# Patient Record
Sex: Male | Born: 1965 | Race: White | Hispanic: No | Marital: Married | Smoking: Never smoker
Health system: Southern US, Community
[De-identification: ages and names within clinical notes are randomized; demographics above are authoritative.]

## PROBLEM LIST (undated history)

## (undated) DIAGNOSIS — T7840XA Allergy, unspecified, initial encounter: Secondary | ICD-10-CM

## (undated) DIAGNOSIS — G039 Meningitis, unspecified: Secondary | ICD-10-CM

## (undated) DIAGNOSIS — Z87442 Personal history of urinary calculi: Secondary | ICD-10-CM

## (undated) DIAGNOSIS — M79606 Pain in leg, unspecified: Secondary | ICD-10-CM

## (undated) DIAGNOSIS — E119 Type 2 diabetes mellitus without complications: Secondary | ICD-10-CM

## (undated) DIAGNOSIS — H9190 Unspecified hearing loss, unspecified ear: Secondary | ICD-10-CM

## (undated) DIAGNOSIS — R7303 Prediabetes: Secondary | ICD-10-CM

## (undated) DIAGNOSIS — K219 Gastro-esophageal reflux disease without esophagitis: Secondary | ICD-10-CM

## (undated) DIAGNOSIS — M722 Plantar fascial fibromatosis: Secondary | ICD-10-CM

## (undated) DIAGNOSIS — Z8709 Personal history of other diseases of the respiratory system: Secondary | ICD-10-CM

## (undated) DIAGNOSIS — S52501A Unspecified fracture of the lower end of right radius, initial encounter for closed fracture: Secondary | ICD-10-CM

## (undated) HISTORY — DX: Pain in leg, unspecified: M79.606

## (undated) HISTORY — DX: Unspecified hearing loss, unspecified ear: H91.90

## (undated) HISTORY — PX: VASECTOMY: SHX75

## (undated) HISTORY — PX: WISDOM TOOTH EXTRACTION: SHX21

## (undated) HISTORY — DX: Allergy, unspecified, initial encounter: T78.40XA

## (undated) HISTORY — PX: LIPOMA EXCISION: SHX5283

## (undated) HISTORY — PX: ESOPHAGEAL DILATION: SHX303

## (undated) HISTORY — PX: APPENDECTOMY: SHX54

## (undated) HISTORY — DX: Meningitis, unspecified: G03.9

## (undated) HISTORY — DX: Plantar fascial fibromatosis: M72.2

## (undated) HISTORY — DX: Personal history of other diseases of the respiratory system: Z87.09

---

## 2000-03-19 ENCOUNTER — Other Ambulatory Visit: Admission: RE | Admit: 2000-03-19 | Discharge: 2000-03-19 | Payer: Self-pay | Admitting: Urology

## 2000-03-19 ENCOUNTER — Encounter (INDEPENDENT_AMBULATORY_CARE_PROVIDER_SITE_OTHER): Payer: Self-pay | Admitting: Specialist

## 2010-01-05 ENCOUNTER — Ambulatory Visit: Payer: Self-pay | Admitting: Family Medicine

## 2013-11-05 ENCOUNTER — Ambulatory Visit (INDEPENDENT_AMBULATORY_CARE_PROVIDER_SITE_OTHER): Payer: No Typology Code available for payment source | Admitting: Medical

## 2013-11-05 ENCOUNTER — Encounter: Payer: Self-pay | Admitting: Medical

## 2013-11-05 VITALS — BP 102/70 | HR 78 | Temp 98.0°F | Resp 16 | Ht 73.0 in | Wt 364.0 lb

## 2013-11-05 DIAGNOSIS — R112 Nausea with vomiting, unspecified: Secondary | ICD-10-CM

## 2013-11-05 DIAGNOSIS — M79609 Pain in unspecified limb: Secondary | ICD-10-CM

## 2013-11-05 DIAGNOSIS — R200 Anesthesia of skin: Secondary | ICD-10-CM

## 2013-11-05 DIAGNOSIS — E669 Obesity, unspecified: Secondary | ICD-10-CM

## 2013-11-05 DIAGNOSIS — R1310 Dysphagia, unspecified: Secondary | ICD-10-CM

## 2013-11-05 DIAGNOSIS — G5710 Meralgia paresthetica, unspecified lower limb: Secondary | ICD-10-CM

## 2013-11-05 DIAGNOSIS — H9190 Unspecified hearing loss, unspecified ear: Secondary | ICD-10-CM

## 2013-11-05 DIAGNOSIS — Z0000 Encounter for general adult medical examination without abnormal findings: Secondary | ICD-10-CM

## 2013-11-05 DIAGNOSIS — M79606 Pain in leg, unspecified: Secondary | ICD-10-CM

## 2013-11-05 DIAGNOSIS — R209 Unspecified disturbances of skin sensation: Secondary | ICD-10-CM

## 2013-11-05 LAB — CBC WITH DIFFERENTIAL/PLATELET
Basophils Absolute: 0.1 10*3/uL (ref 0.0–0.1)
Basophils Relative: 1 % (ref 0–1)
Eosinophils Absolute: 0.3 10*3/uL (ref 0.0–0.7)
Eosinophils Relative: 4 % (ref 0–5)
HCT: 42.3 % (ref 39.0–52.0)
Hemoglobin: 15.4 g/dL (ref 13.0–17.0)
Lymphocytes Relative: 24 % (ref 12–46)
Lymphs Abs: 1.8 10*3/uL (ref 0.7–4.0)
MCH: 30.7 pg (ref 26.0–34.0)
MCHC: 36.4 g/dL — ABNORMAL HIGH (ref 30.0–36.0)
MCV: 84.3 fL (ref 78.0–100.0)
Monocytes Absolute: 0.4 10*3/uL (ref 0.1–1.0)
Monocytes Relative: 6 % (ref 3–12)
Neutro Abs: 4.8 10*3/uL (ref 1.7–7.7)
Neutrophils Relative %: 65 % (ref 43–77)
Platelets: 262 10*3/uL (ref 150–400)
RBC: 5.02 MIL/uL (ref 4.22–5.81)
RDW: 12.7 % (ref 11.5–15.5)
WBC: 7.4 10*3/uL (ref 4.0–10.5)

## 2013-11-05 LAB — POCT URINALYSIS DIPSTICK
Bilirubin, UA: NEGATIVE
Glucose, UA: NEGATIVE
KETONES UA: NEGATIVE
Leukocytes, UA: NEGATIVE
Nitrite, UA: NEGATIVE
PH UA: 5
PROTEIN UA: NEGATIVE
RBC UA: NEGATIVE
SPEC GRAV UA: 1.01
UROBILINOGEN UA: NEGATIVE

## 2013-11-05 NOTE — Progress Notes (Signed)
Subjective:   HPI  Nathaniel Hahn is a 48 y.o. male who presents for a complete physical.  Returning old patient.  Accompanied by wife today.   Preventative care: Last ophthalmology visit:N/A Last dental visit:YES DR. BAGLEY Last colonoscopy:N/A Last prostate exam: YES-2006 Last EKG:YES YEARS AGO Last labs:2006  Prior vaccinations: TD or Tdap:01/05/2010 Influenza:2009 Pneumococcal:N/A Shingles/Zostavax:N/A  Advanced directive:N/A Health care power of attorney:N/A Living will:N/A  Concerns: Sometimes when he eats has feeling in throat like something is caught.  Will try and clear throat. Will drink   Gets leg numbness, upper thigh/lateral thigh ongoing.  No weakness. No back pain, no abdominal pain.  Reviewed their medical, surgical, family, social, medication, and allergy history and updated chart as appropriate.  Past Medical History  Diagnosis Date  . Allergy   . History of sinusitis     yearly  . Hearing loss     history of working around loud machinery  . Plantar fasciitis   . Leg pain, diffuse   . Meningitis     6 months old    Past Surgical History  Procedure Laterality Date  . Appendectomy    . Wisdom tooth extraction    . Vasectomy      History   Social History  . Marital Status: Married    Spouse Name: N/A    Number of Children: N/A  . Years of Education: N/A   Occupational History  . Not on file.   Social History Main Topics  . Smoking status: Never Smoker   . Smokeless tobacco: Not on file  . Alcohol Use: No  . Drug Use: No  . Sexual Activity: Not on file   Other Topics Concern  . 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 COPD Mother   . Hiatal hernia Mother   . Asthma Sister   . Heart disease Neg Hx   . Hyperlipidemia Neg Hx   . Hypertension Neg Hx   . Stroke Neg Hx   . Cancer Neg Hx     No current outpatient  prescriptions on file.  Allergies  Allergen Reactions  . Penicillins       Review of Systems Constitutional: -fever, -chills, -sweats, -unexpected weight change, -decreased appetite, +fatigue(DUE TO THE HOURS HE WORKS) Allergy: -sneezing, -itching, -congestion Dermatology: -changing moles, --rash, -lumps ENT: -runny nose, -ear pain, -sore throat, -hoarseness, -sinus pain, -teeth pain, - ringing in ears, -hearing loss, -nosebleeds Cardiology: -chest pain, -palpitations, -swelling, -difficulty breathing when lying flat, -waking up short of breath Respiratory: -cough, -shortness of breath, -difficulty breathing with exercise or exertion, -wheezing, -coughing up blood Gastroenterology: -abdominal pain, -nausea, -vomiting, -diarrhea, -constipation, -blood in stool, -changes in bowel movement, +difficulty swallowing or eating Hematology: -bleeding, -bruising  Musculoskeletal: -joint aches, -muscle aches, -joint swelling, -back pain, -neck pain, -cramping, -changes in gait Ophthalmology: denies vision changes, eye redness, itching, discharge Urology: -burning with urination, -difficulty urinating, -blood in urine, -urinary frequency, -urgency, -incontinence Neurology: -headache, -weakness, -tingling, +numbness, -memory loss, -falls, -dizziness Psychology: -depressed mood, -agitation, +sleep problems(DUE TO HOURS HE WORKS)     Objective:   Physical Exam  BP 102/70  Pulse 78  Temp(Src) 98 F (36.7 C) (Oral)  Resp 16  Ht 6\' 1"  (1.854 m)  Wt 364 lb (165.109 kg)  BMI 48.03 kg/m2  General appearance: alert, no distress, WD/WN, morbidly obese white  male Skin: scattered macules, left upper back with inflamed skin tag, no worrisome lesions HEENT: normocephalic, conjunctiva/corneas normal, sclerae anicteric, PERRLA, EOMi, nares patent, no discharge or erythema, pharynx normal Oral cavity: MMM, tongue normal, teeth in good repair Neck: supple, no lymphadenopathy, no thyromegaly, no masses,  normal ROM, on bruits Chest: non tender, normal shape and expansion Heart: RRR, normal S1, S2, no murmurs Lungs: CTA bilaterally, no wheezes, rhonchi, or rales Abdomen: +bs, soft, non tender, non distended, no masses, no hepatomegaly, no splenomegaly, no bruits Back: non tender, normal ROM, no scoliosis Musculoskeletal: upper extremities non tender, no obvious deformity, normal ROM throughout, lower extremities non tender, no obvious deformity, normal ROM throughout Extremities: mild varicosities of LE, otherwise no edema, no cyanosis, no clubbing Pulses: 2+ symmetric, upper and lower extremities, normal cap refill Neurological: right upper thigh/lateral thigh decreased sensation compared to rest of LE, otherwise alert, oriented x 3, CN2-12 intact, strength normal upper extremities and lower extremities, sensation normal throughout, DTRs 2+ throughout, no cerebellar signs, gait normal Psychiatric: normal affect, behavior normal, pleasant  GU: normal male external genitalia, circumcised, nontender, no masses, no hernia, no lymphadenopathy Rectal: deferred   Adult ECG Report  Indication: obesity, new exercise program  Rate: 75bpm  Rhythm: normal sinus rhythm  QRS Axis: 32 degrees  PR Interval:  QRS Duration:  QTc:  Conduction Disturbances: none  Other Abnormalities: none  Patient's cardiac risk factors are: male gender, obesity (BMI >= 30 kg/m2) and sedentary lifestyle.  EKG comparison: none  Narrative Interpretation: normal EKG     Assessment and Plan :      Encounter Diagnoses  Name Primary?  . Routine general medical examination at a health care facility Yes  . Obesity, unspecified   . Leg pain   . Leg numbness   . Meralgia paraesthetica   . Nausea with vomiting   . Difficulty swallowing   . Hearing loss     Physical exam - discussed healthy lifestyle, diet, exercise, preventative care, vaccinations, and addressed their concerns.   Refer for nutrition  counseling, refer for swallow study Advise weight loss through diet and exercise changes. Advise that we need to get aggressive on his weight loss We discussed his symptoms and exam suggesting meralgia paresthetica and causes.  Advised he see dentist and eye doctor yearly. Hearing exam normal.  Follow-up pending studies

## 2013-11-05 NOTE — Patient Instructions (Signed)
  Thank you for giving me the opportunity to serve you today.    Your diagnosis today includes: Encounter Diagnoses  Name Primary?  . Routine general medical examination at a health care facility Yes  . Obesity, unspecified   . Leg pain   . Leg numbness   . Meralgia paraesthetica   . Nausea with vomiting   . Difficulty swallowing   . Hearing loss      Specific recommendations today include:  We will refer you to nutritionist for counseling on diet  We will refer you for swallow study  Please work on improving your diet and exercising more to try and lose weight  We'll call with lab results  See eye doctor and dentist yearly  Return pending labs.

## 2013-11-06 ENCOUNTER — Other Ambulatory Visit: Payer: Self-pay

## 2013-11-06 LAB — COMPREHENSIVE METABOLIC PANEL
ALK PHOS: 84 U/L (ref 39–117)
ALT: 60 U/L — ABNORMAL HIGH (ref 0–53)
AST: 36 U/L (ref 0–37)
Albumin: 4.1 g/dL (ref 3.5–5.2)
BILIRUBIN TOTAL: 0.7 mg/dL (ref 0.2–1.2)
BUN: 11 mg/dL (ref 6–23)
CO2: 25 meq/L (ref 19–32)
Calcium: 9 mg/dL (ref 8.4–10.5)
Chloride: 104 mEq/L (ref 96–112)
Creat: 0.67 mg/dL (ref 0.50–1.35)
GLUCOSE: 103 mg/dL — AB (ref 70–99)
Potassium: 3.8 mEq/L (ref 3.5–5.3)
Sodium: 140 mEq/L (ref 135–145)
TOTAL PROTEIN: 6.7 g/dL (ref 6.0–8.3)

## 2013-11-06 LAB — TSH: TSH: 0.807 u[IU]/mL (ref 0.350–4.500)

## 2013-11-06 LAB — LIPID PANEL
CHOLESTEROL: 198 mg/dL (ref 0–200)
HDL: 38 mg/dL — AB (ref >39)
LDL CALC: 134 mg/dL — AB (ref 0–99)
TRIGLYCERIDES: 132 mg/dL (ref <150)
Total CHOL/HDL Ratio: 5.2 Ratio
VLDL: 26 mg/dL (ref 0–40)

## 2013-11-06 LAB — HEMOGLOBIN A1C
Hgb A1c MFr Bld: 6.1 % — ABNORMAL HIGH (ref <5.7)
Mean Plasma Glucose: 128 mg/dL — ABNORMAL HIGH (ref <117)

## 2013-11-17 ENCOUNTER — Other Ambulatory Visit: Payer: Self-pay | Admitting: Medical

## 2013-11-17 ENCOUNTER — Ambulatory Visit
Admission: RE | Admit: 2013-11-17 | Discharge: 2013-11-17 | Disposition: A | Discharge status: Home / Self Care | Payer: No Typology Code available for payment source | Source: Ambulatory Visit | Attending: Medical | Admitting: Medical

## 2013-11-17 DIAGNOSIS — E669 Obesity, unspecified: Secondary | ICD-10-CM

## 2013-11-17 DIAGNOSIS — E010 Iodine-deficiency related diffuse (endemic) goiter: Secondary | ICD-10-CM

## 2013-11-17 DIAGNOSIS — R933 Abnormal findings on diagnostic imaging of other parts of digestive tract: Secondary | ICD-10-CM

## 2013-11-21 ENCOUNTER — Other Ambulatory Visit: Payer: Self-pay | Admitting: Family Medicine

## 2013-11-21 NOTE — Progress Notes (Signed)
Patient is aware of his appointment for his Thyroid U/S 11/26/13 @ 1245 pm at [facility]. CLS

## 2013-11-26 ENCOUNTER — Ambulatory Visit
Admission: RE | Admit: 2013-11-26 | Discharge: 2013-11-26 | Disposition: A | Discharge status: Home / Self Care | Payer: No Typology Code available for payment source | Source: Ambulatory Visit | Attending: Medical | Admitting: Medical

## 2013-11-26 DIAGNOSIS — E010 Iodine-deficiency related diffuse (endemic) goiter: Secondary | ICD-10-CM

## 2013-11-26 DIAGNOSIS — R933 Abnormal findings on diagnostic imaging of other parts of digestive tract: Secondary | ICD-10-CM

## 2013-11-27 NOTE — Progress Notes (Signed)
Scheduled follow up Monday June 1 @ 9:45 with Vincenza Hews.

## 2013-12-01 ENCOUNTER — Encounter: Payer: Self-pay | Admitting: Medical

## 2013-12-01 ENCOUNTER — Telehealth: Payer: Self-pay | Admitting: Medical

## 2013-12-01 ENCOUNTER — Ambulatory Visit (INDEPENDENT_AMBULATORY_CARE_PROVIDER_SITE_OTHER): Payer: No Typology Code available for payment source | Admitting: Medical

## 2013-12-01 VITALS — BP 112/82 | HR 82 | Temp 97.6°F | Resp 16 | Wt 368.0 lb

## 2013-12-01 DIAGNOSIS — R945 Abnormal results of liver function studies: Secondary | ICD-10-CM

## 2013-12-01 DIAGNOSIS — R1310 Dysphagia, unspecified: Secondary | ICD-10-CM

## 2013-12-01 DIAGNOSIS — R7301 Impaired fasting glucose: Secondary | ICD-10-CM

## 2013-12-01 DIAGNOSIS — K449 Diaphragmatic hernia without obstruction or gangrene: Secondary | ICD-10-CM

## 2013-12-01 DIAGNOSIS — E669 Obesity, unspecified: Secondary | ICD-10-CM

## 2013-12-01 DIAGNOSIS — E041 Nontoxic single thyroid nodule: Secondary | ICD-10-CM

## 2013-12-01 DIAGNOSIS — R7989 Other specified abnormal findings of blood chemistry: Secondary | ICD-10-CM

## 2013-12-01 DIAGNOSIS — R112 Nausea with vomiting, unspecified: Secondary | ICD-10-CM

## 2013-12-01 NOTE — Telephone Encounter (Signed)
Refer to Dr. Elnoria Howard for swallowing problems, nausea and vomiting.

## 2013-12-01 NOTE — Telephone Encounter (Signed)
Patient is aware of his appointment with Dr. Elnoria Howard 12/03/13 @ 300 pm. CLS [phone]

## 2013-12-01 NOTE — Progress Notes (Signed)
   Subjective:   Nathaniel Hahn is a 48 y.o. male presenting on 12/01/2013 with Follow-up  Here for f/u on swallow study and thyroid ultrasound.  Still having difficulty swallowing.  At his recent physical visit he told me sometimes when he eats has feeling in throat like something is caught. Will try and clear throat.   Often just after starting eating he would have to go to the bathroom and vomit.  Just about every time he is now he has a sensation to vomit or sensation that the food is going down the windpipe instead.  No prior EGD, he has made changes with smaller bites, plenty of water to help with swallowing without improvement.    Here to discuss labs from recent physical. No other aggravating or relieving factors.  No other complaint.  Review of Systems ROS as in subjective      Objective:    Filed Vitals:   12/01/13 1008  BP: 112/82  Pulse: 82  Temp: 97.6 F (36.4 C)  Resp: 16    General appearance: alert, no distress, WD/WN      Assessment: Encounter Diagnoses  Name Primary?  . Obesity, unspecified Yes  . Hiatal hernia   . Difficulty swallowing   . Impaired fasting blood sugar   . Elevated LFTs   . Nausea with vomiting   . Thyroid nodule      Plan: We discussed his findings on his studies and labs.  We discussed his impaired fasting glucose, elevated hemoglobin A1c is 6.1%, mildly elevated liver tests.  Recommend he continue make efforts at diet and exercise to lose weight and eat healthy, to get good sleep everyday.   We discussed his recent swallow study and an ultrasound of the thyroid and neck.  Repeat thyroid ultrasound 1 year given the small nodule.  Referral to gastroenterology to further evaluate his swallow issues.    Nathaniel Hahn was seen today for follow-up.  Diagnoses and associated orders for this visit:  Obesity, unspecified - Ambulatory referral to Gastroenterology  Hiatal hernia - Ambulatory referral to Gastroenterology  Difficulty  swallowing - Ambulatory referral to Gastroenterology  Impaired fasting blood sugar  Elevated LFTs  Nausea with vomiting  Thyroid nodule    Return pending referral.

## 2013-12-03 ENCOUNTER — Other Ambulatory Visit: Payer: Self-pay | Admitting: Gastroenterology

## 2014-01-12 ENCOUNTER — Ambulatory Visit: Payer: Self-pay | Admitting: *Deleted

## 2014-01-16 ENCOUNTER — Ambulatory Visit (HOSPITAL_COMMUNITY)
Admission: RE | Admit: 2014-01-16 | Payer: No Typology Code available for payment source | Source: Ambulatory Visit | Admitting: Gastroenterology

## 2014-01-16 ENCOUNTER — Encounter (HOSPITAL_COMMUNITY): Admission: RE | Payer: Self-pay | Source: Ambulatory Visit

## 2014-01-16 SURGERY — EGD (ESOPHAGOGASTRODUODENOSCOPY)
Anesthesia: Moderate Sedation

## 2014-02-23 ENCOUNTER — Encounter: Payer: Self-pay | Admitting: Dietician

## 2014-02-23 ENCOUNTER — Encounter: Payer: No Typology Code available for payment source | Attending: Family Medicine | Admitting: Dietician

## 2014-02-23 VITALS — Ht 75.0 in | Wt 371.8 lb

## 2014-02-23 DIAGNOSIS — Z713 Dietary counseling and surveillance: Secondary | ICD-10-CM | POA: Diagnosis present

## 2014-02-23 DIAGNOSIS — Z6841 Body Mass Index (BMI) 40.0 and over, adult: Secondary | ICD-10-CM | POA: Insufficient documentation

## 2014-02-23 DIAGNOSIS — E669 Obesity, unspecified: Secondary | ICD-10-CM

## 2014-02-23 NOTE — Progress Notes (Signed)
  Medical Nutrition Therapy:  Appt start time: 0810 end time:  0900.   Assessment:  Primary concerns today: Nathaniel Hahn states that he is here today because he was recommended by his physician for obesity. He reports that he has gained 12-14 pounds in the last month due to his mom passing away; he considers himself a stress eater. 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 His wife and 2 teenage sons also work for [employer]. They may get home between 8-10 pm. Nathaniel Hahn admits that he knows what he needs to do to lose weight but has trouble implementing changes. He likes several vegetables and eats mainly chicken or fish, rarely steak. His HgbA1c is 6.1%.  Preferred Learning Style:   No preference indicated   Learning Readiness:   Contemplating   MEDICATIONS: none   DIETARY INTAKE:  Avoided foods include onions, broccoli, cauliflower, cabbage, tomatoes, cucumbers.    24-hr recall:  B ( AM): breakfast sandwich (Eggwhite Delight from [employer] or Croissandwich from [employer])  Snk ( AM):   L ( PM): sometimes skips, fried chicken or 1-2 slices of pizza Snk ( PM):  D (9-11 PM): Cookout or [employer] or [employer] OR Cracker Barrel or [employer]es Snk ( PM): none  Beverages: 2-4 sodas (Diet Coke or regular Coke)  Usual physical activity: none  Estimated energy needs: 2000-2200 calories  Progress Towards Goal(s):  In progress.   Nutritional Diagnosis:  [place]-3.3 Overweight/obesity As related to excessive energy intake and inappropriate food choices.  As evidenced by HgbA1c 6.1% and BMI 46.6.    Intervention:  Nutrition counseling provided. Encouraged weight loss and exercise to prevent onset of diabetes. Goals: -Develop meal routine -Avoid skipping meals -Goal: 2 "healthy" meals and 1 on the go -Work on cooking more at home -Use crock pot! -Work on [employer]	[place] on Sunday night  -Make a list when you go to the grocery store  -Avoid  keeping "trigger foods" in the house -Increase vegetable intake!  -Bring healthy lunches to work  -Raw veggies  -Sandwiches  -Fruit  -String cheese  -Nuts  -Instead of stress eating:  -Start a project around the house  -Go for a walk, shooting hoops  -Use a pedometer  -Add 2,000-3,000 steps to average  -Cut back on sodas  -Keep ice water with you at work  -Healthy weight loss: 1-2 pounds per week  Teaching Method Utilized:  Scientific laboratory technician   Barriers to learning/adherence to lifestyle change: schedule  Demonstrated degree of understanding via:  Teach Back   Monitoring/Evaluation:  Dietary intake, exercise, and body weight prn.

## 2014-02-23 NOTE — Patient Instructions (Addendum)
-  Develop meal routine -Avoid skipping meals -Goal: 2 "healthy" meals and 1 on the go -Work on cooking more at home -Use crock pot! -Work on [employer]	[place] on Sunday night  -Make a list when you go to the grocery store  -Avoid keeping "trigger foods" in the house -Increase vegetable intake!  -Bring healthy lunches to work  -Raw veggies  -Sandwiches  -Fruit  -String cheese  -Nuts  -Instead of stress eating:  -Start a project around the house  -Go for a walk, shooting hoops  -Use a pedometer  -Add 2,000-3,000 steps to average  -Cut back on sodas  -Keep ice water with you at work  -Healthy weight loss: 1-2 pounds per week

## 2015-04-02 ENCOUNTER — Ambulatory Visit (INDEPENDENT_AMBULATORY_CARE_PROVIDER_SITE_OTHER): Payer: Managed Care, Other (non HMO) | Admitting: Family Medicine

## 2015-04-02 ENCOUNTER — Encounter: Payer: Self-pay | Admitting: Family Medicine

## 2015-04-02 VITALS — BP 140/90 | HR 84 | Wt 390.0 lb

## 2015-04-02 DIAGNOSIS — L600 Ingrowing nail: Secondary | ICD-10-CM

## 2015-04-02 DIAGNOSIS — L03031 Cellulitis of right toe: Secondary | ICD-10-CM

## 2015-04-02 DIAGNOSIS — B351 Tinea unguium: Secondary | ICD-10-CM | POA: Diagnosis not present

## 2015-04-02 MED ORDER — CLARITHROMYCIN 500 MG PO TABS: 500.0000 mg | ORAL_TABLET | Freq: Two times a day (BID) | ORAL | Status: DC

## 2015-04-02 MED ORDER — TRAMADOL HCL 50 MG PO TABS: 50.0000 mg | ORAL_TABLET | Freq: Three times a day (TID) | ORAL | Status: DC | PRN

## 2015-04-02 NOTE — Progress Notes (Signed)
   Subjective:    Patient ID: Nathaniel Hahn, male    DOB: 07/13/1965, 49 y.o.   MRN: [id number]  HPI  He started noting pain and swelling of the right great toenail that got much worse yesterday and became more red and painful today. He has had previous nail excisions in the past.   Review of Systems     Objective:   Physical Exam  great toenail is thickened with evidence of recent bleeding laterally. The great toe is red and tender on the dorsal and plantar surface.       Assessment & Plan:  Ingrown nail - Plan: clarithromycin (BIAXIN) 500 MG tablet, Ambulatory referral to Podiatry, traMADol (ULTRAM) 50 MG tablet  Cellulitis of toe of right foot - Plan: clarithromycin (BIAXIN) 500 MG tablet, Ambulatory referral to Podiatry, traMADol (ULTRAM) 50 MG tablet  Onychomycosis  he has a penicillin allergy and I will therefore place him on Biaxin. We'll also give him tramadol for pain. He will be referred to podiatry. Cautioned that if his symptoms worsen over the weekend, go to the emergency room.

## 2015-07-30 ENCOUNTER — Emergency Department (HOSPITAL_COMMUNITY)
Admission: EM | Admit: 2015-07-30 | Discharge: 2015-07-31 | Disposition: A | Discharge status: Home / Self Care | Payer: Managed Care, Other (non HMO) | Attending: Emergency Medicine | Admitting: Emergency Medicine

## 2015-07-30 ENCOUNTER — Encounter (HOSPITAL_COMMUNITY): Payer: Self-pay | Admitting: Emergency Medicine

## 2015-07-30 ENCOUNTER — Emergency Department (HOSPITAL_COMMUNITY): Payer: Managed Care, Other (non HMO)

## 2015-07-30 DIAGNOSIS — W010XXA Fall on same level from slipping, tripping and stumbling without subsequent striking against object, initial encounter: Secondary | ICD-10-CM | POA: Insufficient documentation

## 2015-07-30 DIAGNOSIS — S52601A Unspecified fracture of lower end of right ulna, initial encounter for closed fracture: Secondary | ICD-10-CM | POA: Diagnosis not present

## 2015-07-30 DIAGNOSIS — H9190 Unspecified hearing loss, unspecified ear: Secondary | ICD-10-CM | POA: Diagnosis not present

## 2015-07-30 DIAGNOSIS — Z792 Long term (current) use of antibiotics: Secondary | ICD-10-CM | POA: Insufficient documentation

## 2015-07-30 DIAGNOSIS — Z8709 Personal history of other diseases of the respiratory system: Secondary | ICD-10-CM | POA: Insufficient documentation

## 2015-07-30 DIAGNOSIS — Z880 Allergy status to penicillin: Secondary | ICD-10-CM | POA: Diagnosis not present

## 2015-07-30 DIAGNOSIS — S62101A Fracture of unspecified carpal bone, right wrist, initial encounter for closed fracture: Secondary | ICD-10-CM

## 2015-07-30 DIAGNOSIS — Y998 Other external cause status: Secondary | ICD-10-CM | POA: Diagnosis not present

## 2015-07-30 DIAGNOSIS — Z8669 Personal history of other diseases of the nervous system and sense organs: Secondary | ICD-10-CM | POA: Diagnosis not present

## 2015-07-30 DIAGNOSIS — S80211A Abrasion, right knee, initial encounter: Secondary | ICD-10-CM | POA: Diagnosis not present

## 2015-07-30 DIAGNOSIS — Z8739 Personal history of other diseases of the musculoskeletal system and connective tissue: Secondary | ICD-10-CM | POA: Insufficient documentation

## 2015-07-30 DIAGNOSIS — Y9389 Activity, other specified: Secondary | ICD-10-CM | POA: Insufficient documentation

## 2015-07-30 DIAGNOSIS — S6991XA Unspecified injury of right wrist, hand and finger(s), initial encounter: Secondary | ICD-10-CM | POA: Diagnosis present

## 2015-07-30 DIAGNOSIS — S52501A Unspecified fracture of the lower end of right radius, initial encounter for closed fracture: Secondary | ICD-10-CM | POA: Insufficient documentation

## 2015-07-30 DIAGNOSIS — Y9264 Mine or pit as the place of occurrence of the external cause: Secondary | ICD-10-CM | POA: Diagnosis not present

## 2015-07-30 MED ORDER — HYDROMORPHONE HCL 1 MG/ML IJ SOLN
1.0000 mg | Freq: Once | INTRAMUSCULAR | Status: AC
  Administered 2015-07-30: 1 mg via INTRAMUSCULAR
  Filled 2015-07-30: qty 1

## 2015-07-30 MED ORDER — OXYCODONE-ACETAMINOPHEN 5-325 MG PO TABS: 1.0000 | ORAL_TABLET | ORAL | Status: DC | PRN

## 2015-07-30 NOTE — Discharge Instructions (Signed)
Take pain medications as prescribed. Keep the wrist elevated. Ice several times a day. Please follow-up with Dr. Merlyn Lot, call their office first thing on Monday. Follow-up with him for further treatment.  Wrist Fracture A wrist fracture is a break or crack in one of the bones of your wrist. Your wrist is made up of eight small bones at the palm of your hand (carpal bones) and two long bones that make up your forearm (radius and ulna). CAUSES  A direct blow to the wrist.  Falling on an outstretched hand.  Trauma, such as a car accident or a fall. RISK FACTORS Risk factors for wrist fracture include:  Participating in contact and high-risk sports, such as skiing, biking, and ice skating.  Taking steroid medicines.  Smoking.  Being male.  Being Caucasian.  Drinking more than three alcoholic beverages per day.  Having low or lowered bone density (osteoporosis or osteopenia).  Age. Older adults have decreased bone density.  Women who have had menopause.  History of previous fractures. SIGNS AND SYMPTOMS Symptoms of wrist fractures include tenderness, bruising, and inflammation. Additionally, the wrist may hang in an odd position or appear deformed. DIAGNOSIS Diagnosis may include:  Physical exam.  X-ray. TREATMENT Treatment depends on many factors, including the nature and location of the fracture, your age, and your activity level. Treatment for wrist fracture can be nonsurgical or surgical. Nonsurgical Treatment A plaster cast or splint may be applied to your wrist if the bone is in a good position. If the fracture is not in good position, it may be necessary for your health care provider to realign it before applying a splint or cast. Usually, a cast or splint will be worn for several weeks. Surgical Treatment Sometimes the position of the bone is so far out of place that surgery is required to apply a device to hold it together as it heals. Depending on the fracture,  there are a number of options for holding the bone in place while it heals, such as a cast and metal pins. HOME CARE INSTRUCTIONS  Keep your injured wrist elevated and move your fingers as much as possible.  Do not put pressure on any part of your cast or splint. It may break.  Use a plastic bag to protect your cast or splint from water while bathing or showering. Do not lower your cast or splint into water.  Take medicines only as directed by your health care provider.  Keep your cast or splint clean and dry. If it becomes wet, damaged, or suddenly feels too tight, contact your health care provider right away.  Do not use any tobacco products including cigarettes, chewing tobacco, or electronic cigarettes. Tobacco can delay bone healing. If you need help quitting, ask your health care provider.  Keep all follow-up visits as directed by your health care provider. This is important.  Ask your health care provider if you should take supplements of calcium and vitamins C and D to promote bone healing. SEEK MEDICAL CARE IF:  Your cast or splint is damaged, breaks, or gets wet.  You have a fever.  You have chills.  You have continued severe pain or more swelling than you did before the cast was put on. SEEK IMMEDIATE MEDICAL CARE IF:  Your hand or fingernails on the injured arm turn blue or gray, or feel cold or numb.  You have decreased feeling in the fingers of your injured arm. MAKE SURE YOU:  Understand these instructions.  Will  watch your condition.  Will get help right away if you are not doing well or get worse.   This information is not intended to replace advice given to you by your health care provider. Make sure you discuss any questions you have with your health care provider.   Document Released: 03/29/2005 Document Revised: 03/10/2015 Document Reviewed: 07/07/2011 Elsevier Interactive Patient Education [employer].

## 2015-07-30 NOTE — ED Notes (Signed)
Ortho tech paged for splint.

## 2015-07-30 NOTE — ED Provider Notes (Signed)
CSN: [id number]     Arrival date & time 07/30/15  2031 History  By signing my name below, I, Nathaniel Hahn, attest that this documentation has been prepared under the direction and in the presence of Noah Lembke, PA-C. Electronically Signed: [name], ED Scribe. 07/30/2015. 10:18 PM.    Chief Complaint  Patient presents with  . Wrist Injury   The history is provided by the patient. No language interpreter was used.    HPI Comments: Nathaniel Hahn is a 50 y.o. male who is right hand dominant presents to the Emergency Department complaining of a fall that occurred at 5:30 PM. Pt notes he slipped in gravel while leaning against a car resulting in associated, 8/10, right wrist pain and a mild abrasion to his right knee. He was seen at [facility] following the incident and dx with a closed fracture to his right distal radius and was told to come to the ED for a wrist reduction to displace the fracture. He was given Toradol for his pain which provided short term relief noting it has worsened again since arrival. Pt states his pain worsens with any movement of the arm or fingers. He confirms his listed allergies. Pt denies right elbow pain, neck pain, back pain, LOC or any other associated symptoms at this time.   Past Medical History  Diagnosis Date  . Allergy   . History of sinusitis     yearly  . Hearing loss     history of working around loud machinery  . Plantar fasciitis   . Leg pain, diffuse   . Meningitis     6 months old   Past Surgical History  Procedure Laterality Date  . Appendectomy    . Wisdom tooth extraction    . Vasectomy     Family History  Problem Relation Age of Onset  . COPD Mother   . Hiatal hernia Mother   . Asthma Sister   . Heart disease Neg Hx   . Hyperlipidemia Neg Hx   . Hypertension Neg Hx   . Stroke Neg Hx   . Cancer Neg Hx    Social History  Substance Use Topics  . Smoking status: Never Smoker   . Smokeless tobacco: None  . Alcohol Use:  Yes    Review of Systems  Musculoskeletal: Positive for joint swelling and arthralgias. Negative for back pain and neck pain.  Skin: Positive for wound.  Neurological: Negative for syncope.    Allergies  Onion and Penicillins  Home Medications   Prior to Admission medications   Medication Sig Start Date End Date Taking? Authorizing Provider  clarithromycin (BIAXIN) 500 MG tablet Take 1 tablet (500 mg total) by mouth 2 (two) times daily. 04/02/15   [name], MD  traMADol (ULTRAM) 50 MG tablet Take 1 tablet (50 mg total) by mouth every 8 (eight) hours as needed. 04/02/15   [name], MD   BP 120/68 mmHg  Pulse 93  Temp(Src) 98.5 F (36.9 C) (Oral)  Resp 16  SpO2 92%    Physical Exam  Constitutional: He is oriented to person, place, and time. He appears well-developed and well-nourished.  HENT:  Head: Normocephalic and atraumatic.  Eyes: EOM are normal.  Neck: Normal range of motion.  Cardiovascular: Normal rate.   Pulmonary/Chest: Effort normal. No respiratory distress.  Abdominal: Soft.  Musculoskeletal: Normal range of motion.  Obvious deformity to the right wrist. Unable to move. Hand and fingers are pink and warm. Cap refill  less than 2 seconds distally. Radial pulse intact. No tenderness over elbow joint.  Neurological: He is alert and oriented to person, place, and time.  Skin: Skin is warm and dry.  Psychiatric: He has a normal mood and affect.  Nursing note and vitals reviewed.   ED Course  Procedures  DIAGNOSTIC STUDIES: Oxygen Saturation is 92% on RA, adequate by my interpretation.    COORDINATION OF CARE: 10:15 PM Discussed next steps with pt. He understood and is agreeable to the plan.   Labs Review Labs Reviewed - No data to display  Imaging Review Dg Wrist Complete Right  07/30/2015  CLINICAL DATA:  Fall with distal radius fracture. EXAM: RIGHT WRIST - COMPLETE 3+ VIEW COMPARISON:  None. FINDINGS: Three views study has fine detail obscured  by the overlying fiberglass cast. There is a comminuted fracture the distal radius with intra-articular extension. Associated ulnar styloid fracture noted. IMPRESSION: Distal fractures of the radius and ulna. Electronically Signed   By: [name] M.D.   On: 07/30/2015 23:08   I have personally reviewed and evaluated these images and lab results as part of my medical decision-making.   EKG Interpretation None      MDM   Final diagnoses:  Wrist fracture, right, closed, initial encounter   Patient emergency department from urgent care with right wrist fracture. Patient has images on the CD with him. I tried multiple computers to open images but was unable to do so. Will reorder images, also so that the consulting physician can review on upon consultation. Patient informed of the plan. Dilaudid IM ordered for pain. Patient states he had Toradol at urgent care which did not help much.   11:33 PM Fracture as described above. Spoke with Dr. Merlyn Lot, who instructed to place patient in a sugar tong splint, follow-up with him on Monday in the office. I discussed plan with patient, patient initially hesitant, surprise that "you're not going to do anything today? I am not even going to see orthopedics doctor today?." I explained to him that i did speak with  Dr. Merlyn Lot did not think that his wrist needed emergent reduction based on the x-rays that he reviewed. He most likely will need surgical repair of the wrist. He will be seen this upcoming week by Dr. Merlyn Lot and this will be discussed and planned. Patient explained that he is more surprised and upset because urgent care that sent him here told him that he will see orthopedic specialist here and his wrist will be fixed today. Splints are placed by our orthopedic staff. He'll be discharged home with pain management, elevation of the wrist, icing, follow-up as instructed. Patient agreed to the plan.  Filed Vitals:   07/30/15 2038  BP: 120/68  Pulse: 93   Temp: 98.5 F (36.9 C)  TempSrc: Oral  Resp: 16  SpO2: 92%    I personally performed the services described in this documentation, which was scribed in my presence. The recorded information has been reviewed and is accurate.   Jaynie Crumble, PA-C 07/31/15 1931  Richardean Canal, MD 07/31/15 2246

## 2015-07-30 NOTE — ED Notes (Addendum)
Pt slipped in gravel while leaning against car and fell around 5:30pm today.  Went to [facility] and diagnosed with closed fracture to R wrist.  Pt states he was told to come to ED for wrist reduction due to displaced radius fracture.  Pt states he received Toradol for pain.  C/o 8/10 pain to R wrist and abrasion to R knee.  Denies LOC.  Denies neck and back pain.

## 2015-08-02 ENCOUNTER — Other Ambulatory Visit: Payer: Self-pay | Admitting: Orthopedic Surgery

## 2015-08-02 ENCOUNTER — Encounter (HOSPITAL_BASED_OUTPATIENT_CLINIC_OR_DEPARTMENT_OTHER): Payer: Self-pay | Admitting: *Deleted

## 2015-08-03 ENCOUNTER — Ambulatory Visit (HOSPITAL_BASED_OUTPATIENT_CLINIC_OR_DEPARTMENT_OTHER): Payer: Managed Care, Other (non HMO) | Admitting: Certified Registered"

## 2015-08-03 ENCOUNTER — Encounter (HOSPITAL_BASED_OUTPATIENT_CLINIC_OR_DEPARTMENT_OTHER): Payer: Self-pay | Admitting: *Deleted

## 2015-08-03 ENCOUNTER — Encounter (HOSPITAL_BASED_OUTPATIENT_CLINIC_OR_DEPARTMENT_OTHER)
Admission: RE | Disposition: A | Discharge status: Home / Self Care | Payer: Self-pay | Source: Ambulatory Visit | Attending: Orthopedic Surgery

## 2015-08-03 ENCOUNTER — Ambulatory Visit (HOSPITAL_BASED_OUTPATIENT_CLINIC_OR_DEPARTMENT_OTHER)
Admission: RE | Admit: 2015-08-03 | Discharge: 2015-08-03 | Disposition: A | Discharge status: Home / Self Care | Payer: Managed Care, Other (non HMO) | Source: Ambulatory Visit | Attending: Orthopedic Surgery | Admitting: Orthopedic Surgery

## 2015-08-03 DIAGNOSIS — W010XXA Fall on same level from slipping, tripping and stumbling without subsequent striking against object, initial encounter: Secondary | ICD-10-CM | POA: Diagnosis not present

## 2015-08-03 DIAGNOSIS — S52571A Other intraarticular fracture of lower end of right radius, initial encounter for closed fracture: Secondary | ICD-10-CM | POA: Diagnosis not present

## 2015-08-03 HISTORY — PX: OPEN REDUCTION INTERNAL FIXATION (ORIF) DISTAL RADIAL FRACTURE: SHX5989

## 2015-08-03 HISTORY — DX: Unspecified fracture of the lower end of right radius, initial encounter for closed fracture: S52.501A

## 2015-08-03 SURGERY — OPEN REDUCTION INTERNAL FIXATION (ORIF) DISTAL RADIUS FRACTURE
Anesthesia: General | Site: Wrist | Laterality: Right

## 2015-08-03 MED ORDER — SCOPOLAMINE 1 MG/3DAYS TD PT72: 1.0000 | MEDICATED_PATCH | Freq: Once | TRANSDERMAL | Status: DC | PRN

## 2015-08-03 MED ORDER — VANCOMYCIN HCL 10 G IV SOLR
1500.0000 mg | INTRAVENOUS | Status: AC
  Administered 2015-08-03: 1500 mg via INTRAVENOUS

## 2015-08-03 MED ORDER — LACTATED RINGERS IV SOLN: INTRAVENOUS | Status: DC

## 2015-08-03 MED ORDER — MIDAZOLAM HCL 2 MG/2ML IJ SOLN
1.0000 mg | INTRAMUSCULAR | Status: DC | PRN
  Administered 2015-08-03 (×2): 2 mg via INTRAVENOUS

## 2015-08-03 MED ORDER — FENTANYL CITRATE (PF) 100 MCG/2ML IJ SOLN
INTRAMUSCULAR | Status: AC
  Filled 2015-08-03: qty 2

## 2015-08-03 MED ORDER — BUPIVACAINE HCL (PF) 0.25 % IJ SOLN
INTRAMUSCULAR | Status: DC | PRN
  Administered 2015-08-03: 10 mL

## 2015-08-03 MED ORDER — LACTATED RINGERS IV SOLN
INTRAVENOUS | Status: DC
  Administered 2015-08-03 (×2): via INTRAVENOUS

## 2015-08-03 MED ORDER — ONDANSETRON HCL 4 MG/2ML IJ SOLN
INTRAMUSCULAR | Status: DC | PRN
  Administered 2015-08-03: 4 mg via INTRAVENOUS

## 2015-08-03 MED ORDER — HYDROMORPHONE HCL 1 MG/ML IJ SOLN
INTRAMUSCULAR | Status: AC
  Filled 2015-08-03: qty 1

## 2015-08-03 MED ORDER — DEXAMETHASONE SODIUM PHOSPHATE 10 MG/ML IJ SOLN
INTRAMUSCULAR | Status: DC | PRN
  Administered 2015-08-03: 10 mg via INTRAVENOUS

## 2015-08-03 MED ORDER — VANCOMYCIN HCL IN DEXTROSE 1-5 GM/200ML-% IV SOLN: 1000.0000 mg | INTRAVENOUS | Status: DC

## 2015-08-03 MED ORDER — BUPIVACAINE-EPINEPHRINE (PF) 0.5% -1:200000 IJ SOLN
INTRAMUSCULAR | Status: DC | PRN
  Administered 2015-08-03: 30 mL via PERINEURAL

## 2015-08-03 MED ORDER — MIDAZOLAM HCL 2 MG/2ML IJ SOLN
INTRAMUSCULAR | Status: AC
  Filled 2015-08-03: qty 2

## 2015-08-03 MED ORDER — BUPIVACAINE HCL (PF) 0.25 % IJ SOLN
INTRAMUSCULAR | Status: AC
  Filled 2015-08-03: qty 30

## 2015-08-03 MED ORDER — ONDANSETRON HCL 4 MG/2ML IJ SOLN
INTRAMUSCULAR | Status: AC
  Filled 2015-08-03: qty 2

## 2015-08-03 MED ORDER — ACETAMINOPHEN 10 MG/ML IV SOLN
INTRAVENOUS | Status: AC
  Filled 2015-08-03: qty 100

## 2015-08-03 MED ORDER — FENTANYL CITRATE (PF) 100 MCG/2ML IJ SOLN
50.0000 ug | INTRAMUSCULAR | Status: AC | PRN
  Administered 2015-08-03: 100 ug via INTRAVENOUS
  Administered 2015-08-03 (×2): 25 ug via INTRAVENOUS
  Administered 2015-08-03: 100 ug via INTRAVENOUS
  Administered 2015-08-03 (×2): 25 ug via INTRAVENOUS

## 2015-08-03 MED ORDER — VANCOMYCIN HCL IN DEXTROSE 1-5 GM/200ML-% IV SOLN
INTRAVENOUS | Status: AC
  Filled 2015-08-03: qty 200

## 2015-08-03 MED ORDER — 0.9 % SODIUM CHLORIDE (POUR BTL) OPTIME
TOPICAL | Status: DC | PRN
  Administered 2015-08-03: 220 mL

## 2015-08-03 MED ORDER — LIDOCAINE HCL (CARDIAC) 20 MG/ML IV SOLN
INTRAVENOUS | Status: AC
  Filled 2015-08-03: qty 5

## 2015-08-03 MED ORDER — PROPOFOL 10 MG/ML IV BOLUS
INTRAVENOUS | Status: DC | PRN
  Administered 2015-08-03: 260 mg via INTRAVENOUS

## 2015-08-03 MED ORDER — CHLORHEXIDINE GLUCONATE 4 % EX LIQD: 60.0000 mL | Freq: Once | CUTANEOUS | Status: DC

## 2015-08-03 MED ORDER — ACETAMINOPHEN 10 MG/ML IV SOLN
1000.0000 mg | Freq: Four times a day (QID) | INTRAVENOUS | Status: DC
  Administered 2015-08-03: 1000 mg via INTRAVENOUS

## 2015-08-03 MED ORDER — LIDOCAINE HCL (CARDIAC) 20 MG/ML IV SOLN
INTRAVENOUS | Status: DC | PRN
  Administered 2015-08-03: 30 mg via INTRAVENOUS

## 2015-08-03 MED ORDER — HYDROMORPHONE HCL 1 MG/ML IJ SOLN
0.2500 mg | INTRAMUSCULAR | Status: DC | PRN
  Administered 2015-08-03 (×3): 0.5 mg via INTRAVENOUS

## 2015-08-03 MED ORDER — OXYCODONE-ACETAMINOPHEN 5-325 MG PO TABS: ORAL_TABLET | ORAL | Status: DC

## 2015-08-03 MED ORDER — DEXAMETHASONE SODIUM PHOSPHATE 10 MG/ML IJ SOLN
INTRAMUSCULAR | Status: AC
  Filled 2015-08-03: qty 1

## 2015-08-03 MED ORDER — KETOROLAC TROMETHAMINE 30 MG/ML IJ SOLN
INTRAMUSCULAR | Status: AC
Start: 2015-08-03 — End: 2015-08-03
  Filled 2015-08-03: qty 1

## 2015-08-03 MED ORDER — MEPERIDINE HCL 25 MG/ML IJ SOLN: 6.2500 mg | INTRAMUSCULAR | Status: DC | PRN

## 2015-08-03 MED ORDER — GLYCOPYRROLATE 0.2 MG/ML IJ SOLN: 0.2000 mg | Freq: Once | INTRAMUSCULAR | Status: DC | PRN

## 2015-08-03 MED ORDER — KETOROLAC TROMETHAMINE 30 MG/ML IJ SOLN
30.0000 mg | Freq: Once | INTRAMUSCULAR | Status: AC
  Administered 2015-08-03: 30 mg via INTRAVENOUS

## 2015-08-03 MED ORDER — PROMETHAZINE HCL 25 MG/ML IJ SOLN: 6.2500 mg | INTRAMUSCULAR | Status: DC | PRN

## 2015-08-03 SURGICAL SUPPLY — 77 items
BANDAGE ACE 3X5.8 VEL STRL LF (GAUZE/BANDAGES/DRESSINGS) ×2 IMPLANT
BANDAGE ACE 4X5 VEL STRL LF (GAUZE/BANDAGES/DRESSINGS) ×2 IMPLANT
BIT DRILL 2.0 LNG QUCK RELEASE (BIT) ×1 IMPLANT
BIT DRILL 2.8X5 QR DISP (BIT) ×2 IMPLANT
BLADE SURG 15 STRL LF DISP TIS (BLADE) ×2 IMPLANT
BLADE SURG 15 STRL SS (BLADE) ×2
BNDG ESMARK 4X9 LF (GAUZE/BANDAGES/DRESSINGS) ×2 IMPLANT
BNDG GAUZE ELAST 4 BULKY (GAUZE/BANDAGES/DRESSINGS) ×2 IMPLANT
BNDG PLASTER X FAST 3X3 WHT LF (CAST SUPPLIES) IMPLANT
CHLORAPREP W/TINT 26ML (MISCELLANEOUS) ×2 IMPLANT
CORDS BIPOLAR (ELECTRODE) ×2 IMPLANT
COVER BACK TABLE 60X90IN (DRAPES) ×2 IMPLANT
COVER MAYO STAND STRL (DRAPES) ×2 IMPLANT
CUFF TOURNIQUET SINGLE 24IN (TOURNIQUET CUFF) ×2 IMPLANT
DRAPE EXTREMITY T 121X128X90 (DRAPE) ×2 IMPLANT
DRAPE OEC MINIVIEW 54X84 (DRAPES) ×2 IMPLANT
DRAPE SURG 17X23 STRL (DRAPES) ×2 IMPLANT
DRILL 2.0 LNG QUICK RELEASE (BIT) ×2
GAUZE SPONGE 4X4 12PLY STRL (GAUZE/BANDAGES/DRESSINGS) ×2 IMPLANT
GAUZE SPONGE 4X4 16PLY XRAY LF (GAUZE/BANDAGES/DRESSINGS) ×2 IMPLANT
GAUZE XEROFORM 1X8 LF (GAUZE/BANDAGES/DRESSINGS) ×2 IMPLANT
GLOVE BIO SURGEON STRL SZ 6.5 (GLOVE) ×2 IMPLANT
GLOVE BIO SURGEON STRL SZ7 (GLOVE) ×2 IMPLANT
GLOVE BIO SURGEON STRL SZ7.5 (GLOVE) ×2 IMPLANT
GLOVE BIOGEL PI IND STRL 7.0 (GLOVE) ×2 IMPLANT
GLOVE BIOGEL PI IND STRL 7.5 (GLOVE) ×1 IMPLANT
GLOVE BIOGEL PI IND STRL 8 (GLOVE) ×1 IMPLANT
GLOVE BIOGEL PI IND STRL 8.5 (GLOVE) IMPLANT
GLOVE BIOGEL PI INDICATOR 7.0 (GLOVE) ×2
GLOVE BIOGEL PI INDICATOR 7.5 (GLOVE) ×1
GLOVE BIOGEL PI INDICATOR 8 (GLOVE) ×1
GLOVE BIOGEL PI INDICATOR 8.5 (GLOVE)
GLOVE SURG ORTHO 8.0 STRL STRW (GLOVE) IMPLANT
GOWN STRL REUS W/ TWL LRG LVL3 (GOWN DISPOSABLE) ×1 IMPLANT
GOWN STRL REUS W/TWL LRG LVL3 (GOWN DISPOSABLE) ×1
GOWN STRL REUS W/TWL XL LVL3 (GOWN DISPOSABLE) ×4 IMPLANT
GUIDEWIRE ORTHO 0.054X6 (WIRE) ×6 IMPLANT
NEEDLE HYPO 25X1 1.5 SAFETY (NEEDLE) ×2 IMPLANT
NS IRRIG 1000ML POUR BTL (IV SOLUTION) ×2 IMPLANT
PACK BASIN DAY SURGERY FS (CUSTOM PROCEDURE TRAY) ×2 IMPLANT
PAD CAST 3X4 CTTN HI CHSV (CAST SUPPLIES) ×1 IMPLANT
PADDING CAST ABS 4INX4YD NS (CAST SUPPLIES) ×1
PADDING CAST ABS COTTON 4X4 ST (CAST SUPPLIES) ×1 IMPLANT
PADDING CAST COTTON 3X4 STRL (CAST SUPPLIES) ×1
PLATE PROXIMAL ACULOC 2 WD RT (Plate) ×2 IMPLANT
SCREW CORT FT 18X2.3XLCK HEX (Screw) ×1 IMPLANT
SCREW CORT FT 20X2.3XLCK HEX (Screw) ×1 IMPLANT
SCREW CORTICAL LOCKING 2.3X18M (Screw) ×1 IMPLANT
SCREW CORTICAL LOCKING 2.3X20M (Screw) ×4 IMPLANT
SCREW CORTICAL LOCKING 2.3X22M (Screw) ×2 IMPLANT
SCREW FX20X2.3XSMTH LCK NS CRT (Screw) ×3 IMPLANT
SCREW FX22X2.3XLCK SMTH NS CRT (Screw) ×2 IMPLANT
SCREW HEXALOBE NON-LOCK 3.5X14 (Screw) ×2 IMPLANT
SCREW HEXALOBE NON-LOCK 3.5X16 (Screw) ×2 IMPLANT
SCREW NLCKG 13 3.5X13 HEXA (Screw) ×1 IMPLANT
SCREW NON-LOCK 3.5X13 (Screw) ×2 IMPLANT
SCREW NONLOCK HEX 3.5X12 (Screw) ×2 IMPLANT
SLEEVE SCD COMPRESS KNEE MED (MISCELLANEOUS) ×2 IMPLANT
SPLINT PLASTER CAST XFAST 4X15 (CAST SUPPLIES) ×15 IMPLANT
SPLINT PLASTER XTRA FAST SET 4 (CAST SUPPLIES) ×15
STOCKINETTE 4X48 STRL (DRAPES) ×2 IMPLANT
SUCTION FRAZIER HANDLE 10FR (MISCELLANEOUS)
SUCTION TUBE FRAZIER 10FR DISP (MISCELLANEOUS) IMPLANT
SUT ETHILON 3 0 PS 1 (SUTURE) ×4 IMPLANT
SUT ETHILON 4 0 PS 2 18 (SUTURE) ×2 IMPLANT
SUT MERSILENE 2.0 SH NDLE (SUTURE) ×2 IMPLANT
SUT VIC AB 2-0 SH 27 (SUTURE) ×1
SUT VIC AB 2-0 SH 27XBRD (SUTURE) ×1 IMPLANT
SUT VIC AB 3-0 PS1 18 (SUTURE)
SUT VIC AB 3-0 PS1 18XBRD (SUTURE) IMPLANT
SUT VICRYL 4-0 PS2 18IN ABS (SUTURE) ×2 IMPLANT
SYR BULB 3OZ (MISCELLANEOUS) ×2 IMPLANT
SYR CONTROL 10ML LL (SYRINGE) ×2 IMPLANT
TOWEL OR 17X24 6PK STRL BLUE (TOWEL DISPOSABLE) ×4 IMPLANT
TOWEL OR NON WOVEN STRL DISP B (DISPOSABLE) ×2 IMPLANT
TUBE CONNECTING 20X1/4 (TUBING) IMPLANT
UNDERPAD 30X30 (UNDERPADS AND DIAPERS) ×2 IMPLANT

## 2015-08-03 NOTE — H&P (Signed)
  Nathaniel Hahn is an 50 y.o. male.   Chief Complaint: right distal radius fracture HPI: 50 yo rhd male states he fell getting out of car 07/30/15 injuring right wrist.  Seen at ED where XR revealed distal radius fracture.  Splinted and followed up in office.  Previous wrist sprain as a child.  No other injury at this time.  Allergies:  Allergies  Allergen Reactions  . Onion   . Penicillins Rash    Past Medical History  Diagnosis Date  . Allergy   . History of sinusitis     yearly  . Hearing loss     history of working around loud machinery  . Plantar fasciitis   . Leg pain, diffuse   . Meningitis     6 months old  . Distal radius fracture, right     Past Surgical History  Procedure Laterality Date  . Appendectomy    . Wisdom tooth extraction    . Vasectomy      Family History: Family History  Problem Relation Age of Onset  . COPD Mother   . Hiatal hernia Mother   . Asthma Sister   . Heart disease Neg Hx   . Hyperlipidemia Neg Hx   . Hypertension Neg Hx   . Stroke Neg Hx   . Cancer Neg Hx     Social History:   reports that he has never smoked. He does not have any smokeless tobacco history on file. He reports that he drinks alcohol. He reports that he does not use illicit drugs.  Medications: Medications Prior to Admission  Medication Sig Dispense Refill  . ibuprofen (ADVIL,MOTRIN) 200 MG tablet Take 200 mg by mouth every 6 (six) hours as needed.    Marland Kitchen oxyCODONE-acetaminophen (PERCOCET) 5-325 MG tablet Take 1-2 tablets by mouth every 4 (four) hours as needed for severe pain. 30 tablet 0    No results found for this or any previous visit (from the past 48 hour(s)).  No results found.   A comprehensive review of systems was negative.  Blood pressure 116/68, pulse 86, temperature 98.6 F (37 C), temperature source Oral, resp. rate 18, height  (1.905 m), weight 175.088 kg (386 lb), SpO2 94 %.  General appearance: alert, cooperative and appears stated  age Head: Normocephalic, without obvious abnormality, atraumatic Neck: supple, symmetrical, trachea midline Resp: clear to auscultation bilaterally Cardio: regular rate and rhythm GI: non-tender Extremities:   Intact sensation and capillary refill all digits.  +epl/fpl/io.  No wounds. Pulses: 2+ and symmetric Skin: Skin color, texture, turgor normal. No rashes or lesions Neurologic: Grossly normal Incision/Wound: none  Assessment/Plan Right distal radius fracture.  Non operative and operative treatment options were discussed with the patient and patient wishes to proceed with operative treatment. Risks, benefits, and alternatives of surgery were discussed and the patient agrees with the plan of care.   Samhitha Rosen R 08/03/2015, 2:41 PM

## 2015-08-03 NOTE — Op Note (Signed)
I assisted Surgeon(s) and Role:    * Betha Loa, MD - Primary    * Cindee Salt, MD - Assisting on the Procedure(s): OPEN REDUCTION INTERNAL FIXATION (ORIF) RIGHT DISTAL RADIUS FRACTURE on 08/03/2015.  I provided assistance on this case as follows: I assisted in the approach, retraction.aid in reduction, and stabilization of the fracture.  I provided assistance in maintaining reduction during fixation, assisting in closure and application of splinting and dressing. I was present during the entire procedure. Electronically signed by: Nicki Reaper, MD Date: 08/03/2015 Time: 4:22 PM

## 2015-08-03 NOTE — Transfer of Care (Signed)
Immediate Anesthesia Transfer of Care Note  Patient: Nathaniel Hahn  Procedure(s) Performed: Procedure(s): OPEN REDUCTION INTERNAL FIXATION (ORIF) RIGHT DISTAL RADIUS FRACTURE (Right)  Patient Location: PACU  Anesthesia Type:GA combined with regional for post-op pain  Level of Consciousness: awake, alert , oriented and patient cooperative  Airway & Oxygen Therapy: Patient Spontanous Breathing and Patient connected to face mask oxygen  Post-op Assessment: Report given to RN and Post -op Vital signs reviewed and stable  Post vital signs: Reviewed and stable  Last Vitals:  Filed Vitals:   08/03/15 1350 08/03/15 1355  BP:    Pulse: 86 86  Temp:    Resp: 18 18    Complications: No apparent anesthesia complications

## 2015-08-03 NOTE — Op Note (Signed)
[id number] 

## 2015-08-03 NOTE — Discharge Instructions (Addendum)

## 2015-08-03 NOTE — Anesthesia Postprocedure Evaluation (Signed)
Anesthesia Post Note  Patient: Nathaniel Hahn  Procedure(s) Performed: Procedure(s) (LRB): OPEN REDUCTION INTERNAL FIXATION (ORIF) RIGHT DISTAL RADIUS FRACTURE (Right)  Patient location during evaluation: PACU Anesthesia Type: General and Regional Level of consciousness: awake and alert Pain management: pain level controlled Vital Signs Assessment: post-procedure vital signs reviewed and stable Respiratory status: spontaneous breathing, nonlabored ventilation, respiratory function stable and patient connected to nasal cannula oxygen Cardiovascular status: blood pressure returned to baseline and stable Postop Assessment: no signs of nausea or vomiting Anesthetic complications: no    Last Vitals:  Filed Vitals:   08/03/15 1630 08/03/15 1645  BP: 154/62 142/66  Pulse: 103 99  Temp:    Resp: 21 16    Last Pain:  Filed Vitals:   08/03/15 1654  PainSc: 10-Worst pain ever                 Shelton Silvas

## 2015-08-03 NOTE — Anesthesia Preprocedure Evaluation (Addendum)
Anesthesia Evaluation  Patient identified by MRN, date of birth, ID band Patient awake    Reviewed: Allergy & Precautions, NPO status , Patient's Chart, lab work & pertinent test results  Airway Mallampati: II  TM Distance: >3 FB Neck ROM: Full    Dental  (+) Teeth Intact   Pulmonary neg pulmonary ROS,    breath sounds clear to auscultation- rhonchi       Cardiovascular negative cardio ROS   Rhythm:Regular Rate:Normal     Neuro/Psych negative neurological ROS  negative psych ROS   GI/Hepatic negative GI ROS, Neg liver ROS,   Endo/Other  negative endocrine ROS  Renal/GU negative Renal ROS  negative genitourinary   Musculoskeletal negative musculoskeletal ROS (+)   Abdominal   Peds negative pediatric ROS (+)  Hematology negative hematology ROS (+)   Anesthesia Other Findings   Reproductive/Obstetrics negative OB ROS                            Lab Results  Component Value Date   WBC 7.4 11/05/2013   HGB 15.4 11/05/2013   HCT 42.3 11/05/2013   MCV 84.3 11/05/2013   PLT 262 11/05/2013   Lab Results  Component Value Date   CREATININE 0.67 11/05/2013   BUN 11 11/05/2013   NA 140 11/05/2013   K 3.8 11/05/2013   CL 104 11/05/2013   CO2 25 11/05/2013   No results found for: INR, PROTIME  10/2013 EKG: normal EKG, normal sinus rhythm.   Anesthesia Physical Anesthesia Plan  ASA: III  Anesthesia Plan: General   Post-op Pain Management: GA combined w/ Regional for post-op pain   Induction: Intravenous  Airway Management Planned: LMA  Additional Equipment:   Intra-op Plan:   Post-operative Plan: Extubation in OR  Informed Consent: I have reviewed the patients History and Physical, chart, labs and discussed the procedure including the risks, benefits and alternatives for the proposed anesthesia with the patient or authorized representative who has indicated his/her  understanding and acceptance.   Dental advisory given  Plan Discussed with: CRNA  Anesthesia Plan Comments:         Anesthesia Quick Evaluation

## 2015-08-03 NOTE — Progress Notes (Signed)
Assisted Dr. Hollis with right, ultrasound guided, supraclavicular block. Side rails up, monitors on throughout procedure. See vital signs in flow sheet. Tolerated Procedure well. 

## 2015-08-03 NOTE — Brief Op Note (Signed)
08/03/2015  4:20 PM  PATIENT:  Nathaniel Hahn  50 y.o. male  PRE-OPERATIVE DIAGNOSIS:  RIGHT DISTAL RADIUS FRACTURE  POST-OPERATIVE DIAGNOSIS:  RIGHT DISTAL RADIUS FRACTURE  PROCEDURE:  Procedure(s): OPEN REDUCTION INTERNAL FIXATION (ORIF) RIGHT DISTAL RADIUS FRACTURE (Right)  SURGEON:  Surgeon(s) and Role:    * Betha Loa, MD - Primary    * Cindee Salt, MD - Assisting  PHYSICIAN ASSISTANT:   ASSISTANTS: Cindee Salt, MD   ANESTHESIA:   regional and general  EBL:  Total I/O In: 1000 [I.V.:1000] Out: -   BLOOD ADMINISTERED:none  DRAINS: none   LOCAL MEDICATIONS USED:  MARCAINE     SPECIMEN:  No Specimen  DISPOSITION OF SPECIMEN:  N/A  COUNTS:  YES  TOURNIQUET:   Total Tourniquet Time Documented: Upper Arm (Right) - 68 minutes Total: Upper Arm (Right) - 68 minutes   DICTATION: .Other Dictation: Dictation Number [id number]  PLAN OF CARE: Discharge to home after PACU  PATIENT DISPOSITION:  PACU - hemodynamically stable.

## 2015-08-03 NOTE — Anesthesia Procedure Notes (Addendum)
Anesthesia Regional Block:  Supraclavicular block  Pre-Anesthetic Checklist: ,, timeout performed, Correct Patient, Correct Site, Correct Laterality, Correct Procedure, Correct Position, site marked, Risks and benefits discussed,  Surgical consent,  Pre-op evaluation,  At surgeon's request and post-op pain management  Laterality: Right  Prep: chloraprep       Needles:  Injection technique: Single-shot  Needle Type: Echogenic Needle     Needle Length: 9cm 9 cm Needle Gauge: 21 and 21 G    Additional Needles:  Procedures: ultrasound guided (picture in chart) Supraclavicular block Narrative:  Start time: 08/03/2015 1:26 PM End time: 08/03/2015 1:28 PM Injection made incrementally with aspirations every 5 mL.  Performed by: Personally  Anesthesiologist: Shona Simpson D  Additional Notes: No immediate complications noted.    Procedure Name: LMA Insertion Date/Time: 08/03/2015 2:55 PM Performed by: Phyllis Abelson D Pre-anesthesia Checklist: Patient identified, Emergency Drugs available, Suction available and Patient being monitored Patient Re-evaluated:Patient Re-evaluated prior to inductionOxygen Delivery Method: Circle System Utilized Preoxygenation: Pre-oxygenation with 100% oxygen Intubation Type: IV induction Ventilation: Mask ventilation without difficulty LMA: LMA inserted LMA Size: 5.0 Number of attempts: 1 Airway Equipment and Method: Bite block Placement Confirmation: positive ETCO2 Tube secured with: Tape Dental Injury: Teeth and Oropharynx as per pre-operative assessment

## 2015-08-03 NOTE — Progress Notes (Signed)
   08/03/15 1304  OBSTRUCTIVE SLEEP APNEA  Have you ever been diagnosed with sleep apnea through a sleep study? No  Do you snore loudly (loud enough to be heard through closed doors)?  1  Do you often feel tired, fatigued, or sleepy during the daytime (such as falling asleep during driving or talking to someone)? 1  Has anyone observed you stop breathing during your sleep? 0  Do you have, or are you being treated for high blood pressure? 0  BMI more than 35 kg/m2? 1  Age > 50 (1-yes) 0  Neck circumference greater than:Male 16 inches or larger, Male 17inches or larger? 1  Male Gender (Yes=1) 1  Obstructive Sleep Apnea Score 5  Score 5 or greater  Results sent to PCP

## 2015-08-03 NOTE — Op Note (Signed)
NAMEGILLES, TRIMPE NO.:  [id number]  MEDICAL RECORD NO.:  [id number]  LOCATION:                                 FACILITY:  PHYSICIAN:  Betha Loa, MD        DATE OF BIRTH:  1966/03/14  DATE OF PROCEDURE:  08/03/2015 DATE OF DISCHARGE:                              OPERATIVE REPORT   PREOPERATIVE DIAGNOSIS:  Right comminuted intra-articular distal radius fracture.  POSTOPERATIVE DIAGNOSIS:  Right comminuted intra-articular distal radius fracture with 3 articular fragments.  PROCEDURE:   1. Open reduction and internal fixation of right comminuted intra-articular distal radius fracture with 3 intra-articular fragments 2. Lengthening of right brachioradialis tendon with repair.  SURGEON:  Betha Loa, MD.  ASSISTANT:  Cindee Salt, MD.  ANESTHESIA:  General with regional.  IV FLUIDS:  Per anesthesia flow sheet.  ESTIMATED BLOOD LOSS:  Minimal.  COMPLICATIONS:  None.  SPECIMENS:  None.  TOURNIQUET TIME:  68 minutes.  DISPOSITION:  Stable to PACU.  INDICATIONS:  [name] is a 50 year old male, who states he slipped while getting out of his car on July 30, 2015, injuring his right wrist.  He was seen in the department, where radiographs were taken revealing distal radius fracture.  Placed in a splint and referred for followup.  We discussed nonoperative and operative treatment options. He wished to proceed with operative fixation.  Risks, benefits, and alternatives of surgery were discussed including risk of blood loss; infection; damage to nerves, vessels, tendons, ligaments, bone; failure of surgery; need for additional surgery; complications with wound healing; continued pain; nonunion; malunion; stiffness.  They voiced understanding of these risks and elected to proceed.  OPERATIVE COURSE:  After being identified preoperatively by myself, the patient and I agreed upon procedure and site of procedure.  Surgical site was marked.  The risks,  benefits, and alternatives of surgery were reviewed and he wished to proceed.  Surgical consent had been signed. He was given IV vancomycin as preoperative antibiotic prophylaxis due to penicillin allergy.  A regional block was performed by Anesthesia in preoperative holding.  He was transferred to the operating room and placed on the operating table in supine position with the right upper extremity on arm board.  General anesthesia was induced by Anesthesiology.  Right upper extremity was prepped and draped in normal sterile orthopedic fashion.  Surgical pause was performed between surgeons, anesthesia, and operating staff; and all were in agreement as to the patient, procedure, and site procedure.  Tourniquet at the proximal aspect of the extremity was inflated to 250 mmHg after exsanguination of the limb with an Esmarch bandage.  Standard volar Sherilyn Cooter approach was used.  Bipolar electrocautery was used to obtain hemostasis throughout the case.  The superficial and deep portions of the FCR tendon were incised and the FCR and FPL were swept ulnarly to protect the palmar cutaneous branch of the median nerve.  The palmar cutaneous branch was identified and was retracted and protected.  The brachioradialis was identified at the radial side of the radius.  It was cut in a stepwise fashion for later repair.  The pronator quadratus was released at the radial side  and elevated with the Therapist, nutritional.  The fracture site was identified.  There was comminution.  There was intra- articular extension at the radial side and in the coronal plane.  The fracture was reduced under direct visualization.  A volar distal radial locking plate from the Acumed set was selected and secured to the bone with guide pins.  The C-arm was used in AP, lateral, and oblique projections throughout the case to aid in reduction.  Once adequate position had been obtained, a standard AO drilling measuring technique was used  to place a screw in the slotted hole in the shaft of the plate. The distal holes were filled with locking pegs with the exception of the styloid holes, which were filled with locking screws.  The remaining holes in the shaft of the plate were filled with nonlocking screws. Good purchase was obtained.  C-arm was used in AP, lateral, and oblique projections throughout the case to aid in reduction and position of hardware.  There was no intra-articular penetration.  Radial length, inclination, volar tilt were restored.  The wound was copiously irrigated with sterile saline.  The brachioradialis was repaired in a lengthened fashion using 2-0 Mersilene suture.  The pronator quadratus was repaired back over top of the plate using a 2-0 Vicryl in a figure- of-eight fashion.  Interrupted Vicryl sutures were placed in subcutaneous tissues, and skin was closed with a 3-0 nylon in a horizontal mattress fashion.  The wound was injected with 10 mL of 0.25% plain Marcaine to augment postoperative analgesia.  It was then dressed with sterile Xeroform, 4x4s, and wrapped with a Kerlix bandage.  A volar splint was placed and wrapped with Kerlix and Ace bandage.  Tourniquet deflated at 68 minutes.  Fingertips were pink with brisk capillary refill after deflation of tourniquet.  Operative drapes were broken down, and the patient was awoken from anesthesia safely.  He was transferred back to stretcher and taken to PACU in stable condition.  I will see him back in the office in 1 week for postoperative followup.  I will give him Percocet 5/325, 1-2 p.o. q.6 hours p.r.n. pain, dispensed #40.     Betha Loa, MD     KK/MEDQ  D:  08/03/2015  T:  08/03/2015  Job:  [id number]

## 2015-08-04 ENCOUNTER — Encounter (HOSPITAL_BASED_OUTPATIENT_CLINIC_OR_DEPARTMENT_OTHER): Payer: Self-pay | Admitting: Orthopedic Surgery

## 2015-08-04 DIAGNOSIS — S52501A Unspecified fracture of the lower end of right radius, initial encounter for closed fracture: Secondary | ICD-10-CM

## 2015-08-04 HISTORY — DX: Unspecified fracture of the lower end of right radius, initial encounter for closed fracture: S52.501A

## 2016-06-14 ENCOUNTER — Encounter: Payer: Self-pay | Admitting: Family Medicine

## 2016-06-14 ENCOUNTER — Ambulatory Visit (INDEPENDENT_AMBULATORY_CARE_PROVIDER_SITE_OTHER): Payer: Managed Care, Other (non HMO) | Admitting: Family Medicine

## 2016-06-14 ENCOUNTER — Encounter: Payer: Self-pay | Admitting: Physician Assistant

## 2016-06-14 VITALS — BP 128/88 | HR 75 | Wt 385.2 lb

## 2016-06-14 DIAGNOSIS — Z8719 Personal history of other diseases of the digestive system: Secondary | ICD-10-CM | POA: Diagnosis not present

## 2016-06-14 DIAGNOSIS — R1310 Dysphagia, unspecified: Secondary | ICD-10-CM

## 2016-06-14 NOTE — Progress Notes (Signed)
Subjective:    Patient ID: Nathaniel CoddingCharles Wing, male    DOB: 07/06/1965, 50 y.o.   MRN: [id number]  HPI Chief Complaint  Patient presents with  . issues with throat    throat issues- when first eating, having trouble swallowing and gets hiccups- multiple times in a week   He is a patient of Vincenza HewsShane, PA and here today to see me with complaints of food getting stuck or possibly an "air pocket" while eating. States sensation is preceded by hiccups. This has been ongoing since 2015 but states it is occurring more frequently and seems to be progressively worsening over the past 2 months. Having 2-3 episodes per week.  States he feels panicky sometimes when this happens. States it takes several minutes and he has to try and relax to get the feeling to go away. He reports having to vomit up food occasionally to get relief.  He was worked up for this issue in 2015 by his PCP Vincenza HewsShane. Went for a swallow study and was referred to GI. Patient states he did not go for an EGD as was recommended in 2015. He has not seen anyone since for this issue.  Denies having history of reflux and is not taking medication for reflux.  Denies fever, chills, sore throat, cough, chest pain, palpitations, abdominal pain, nausea, diarrhea.   Past Medical History:  Diagnosis Date  . Allergy   . Distal radius fracture, right   . Hearing loss    history of working around loud machinery  . History of sinusitis    yearly  . Leg pain, diffuse   . Meningitis    6 months old  . Plantar fasciitis    Past Surgical History:  Procedure Laterality Date  . APPENDECTOMY    . OPEN REDUCTION INTERNAL FIXATION (ORIF) DISTAL RADIAL FRACTURE Right 08/03/2015   Procedure: OPEN REDUCTION INTERNAL FIXATION (ORIF) RIGHT DISTAL RADIUS FRACTURE;  Surgeon: Betha LoaKevin Kuzma, MD;  Location: [facility];  Service: Orthopedics;  Laterality: Right;  Marland Kitchen. VASECTOMY    . WISDOM TOOTH EXTRACTION       Barium Swallow Study was performed in  2015 IMPRESSION: 1. Small hiatal hernia with mild gastroesophageal reflux. 2. Extrinsic indentation upon the left lateral lower cervical esophagus. Question enlarged left thyroid. Consider ultrasound of the thyroid to assess further. 3. Mild tertiary contractions in the mid and distal esophagus   Review of Systems Pertinent positives and negatives in the history of present illness.     Objective:   Physical Exam  Constitutional: He appears well-developed and well-nourished. No distress.  HENT:  Mouth/Throat: Oropharynx is clear and moist.  Eyes: Conjunctivae are normal. Pupils are equal, round, and reactive to light.  Neck: Normal range of motion. Neck supple. No tracheal deviation present. No thyromegaly present.  Cardiovascular: Normal rate, regular rhythm and normal heart sounds.  Exam reveals no gallop and no friction rub.   No murmur heard. Pulmonary/Chest: Effort normal and breath sounds normal. He exhibits no tenderness.  Abdominal: Soft. Bowel sounds are normal. He exhibits no distension and no mass. There is no tenderness. There is no rebound and no guarding.  Obese abdomen   Lymphadenopathy:    He has no cervical adenopathy.  Skin: Skin is warm and dry. No pallor.  Psychiatric: He has a normal mood and affect. His speech is normal and behavior is normal. Thought content normal.   BP 128/88   Pulse 75   Wt (!) 385 lb 3.2 oz (174.7  kg)   BMI 48.15 kg/m       Assessment & Plan:  Dysphagia, unspecified type - Plan: Ambulatory referral to Gastroenterology  History of hiatal hernia - Plan: Ambulatory referral to Gastroenterology  He does not appear to be in any distress. Reviewed swallow study from 2015 with patient.  Plan to send him to GI for further evaluation including a possible EGD as was recommended in 2015.  Recommend that he return in the next few weeks for a CPE and fasting labs with his PCP. He is aware that he had some abnormal labs and a small thyroid  nodule in 2015. He agrees to do this.

## 2016-06-22 ENCOUNTER — Encounter: Payer: Self-pay | Admitting: Physician Assistant

## 2016-06-22 ENCOUNTER — Telehealth: Payer: Self-pay | Admitting: *Deleted

## 2016-06-22 ENCOUNTER — Other Ambulatory Visit: Payer: Self-pay | Admitting: *Deleted

## 2016-06-22 ENCOUNTER — Ambulatory Visit (INDEPENDENT_AMBULATORY_CARE_PROVIDER_SITE_OTHER): Payer: BLUE CROSS/BLUE SHIELD | Admitting: Physician Assistant

## 2016-06-22 VITALS — BP 126/80 | HR 80 | Ht 75.0 in | Wt 386.0 lb

## 2016-06-22 DIAGNOSIS — Z1212 Encounter for screening for malignant neoplasm of rectum: Secondary | ICD-10-CM

## 2016-06-22 DIAGNOSIS — R1319 Other dysphagia: Secondary | ICD-10-CM

## 2016-06-22 DIAGNOSIS — R1314 Dysphagia, pharyngoesophageal phase: Secondary | ICD-10-CM

## 2016-06-22 DIAGNOSIS — Z1211 Encounter for screening for malignant neoplasm of colon: Secondary | ICD-10-CM | POA: Diagnosis not present

## 2016-06-22 NOTE — Progress Notes (Signed)
Chief Complaint: Dysphagia  HPI:   [name] is a 50 year old  Caucasian male, with past medical history as below, who was referred to me by Avanell ShackletonHenson, Vickie L, NP for a complaint of dysphagia.     Per review of records patient had previous workup for this issue in 2015. He went for a swallowing study on 11/17/13 which showed a small hiatal hernia with mild gastroesophageal reflux, extrinsic indentation upon the left lateral lower cervical esophagus with questionable enlarged left thyroid as well as mild tertiary contractions in the mid and distal esophagus. He then had follow-up ultrasound which showed a 5 mm right lobe nodule on his thyroid. Findings did not meet criteria for biopsy. He was then referred to GI, sounds like [facility], it was recommended he have an EGD but never completed this as he was not fully confident in his care that he received there. This is why he is following with us today.   Today, the patient presented to clinic accompanied by his wife and tells me that over the past 6 months he has had an increase in dysphagia symptoms. Typically these would happen 1-2 times a month before then, but over the past 6 have been occurring four times per week. The patient tells me that he will be eating and feels something gets hung up "like on an air pocket", he will then start coughing and have violent hiccups and typically vomit up his food and possibly some stomach acid. He tells after doing this he can sometimes eat afterwards and other times he can have repeat symptoms with only drinking a sip or 2 of water. He tells me that typically 30-60 minutes after an episode he feels fine. This does not seem to be worse with any particular food, though his wife notes that rice seems to be bad. He exaplins that during these episodes he also has associated panic and feels a tightening in his throat. Nothing seems to make this better other than time.   Past medical history significant for a colonoscopy 30 years  ago, patient is unsure where this was completed. He has not had one since age of 50.   Patient denies fever, chills, blood in the stool, melena, change in bowel habits, weight loss, fatigue, anorexia, nausea, heartburn, reflux, abdominal pain or symptoms that awaken him at night.   Past Medical History:  Diagnosis Date  . Allergy   . Distal radius fracture, right   . Hearing loss    history of working around loud machinery  . History of sinusitis    yearly  . Leg pain, diffuse   . Meningitis    6 months old  . Plantar fasciitis     Past Surgical History:  Procedure Laterality Date  . APPENDECTOMY    . OPEN REDUCTION INTERNAL FIXATION (ORIF) DISTAL RADIAL FRACTURE Right 08/03/2015   Procedure: OPEN REDUCTION INTERNAL FIXATION (ORIF) RIGHT DISTAL RADIUS FRACTURE;  Surgeon: Betha LoaKevin Kuzma, MD;  Location: [facility];  Service: Orthopedics;  Laterality: Right;  Marland Kitchen. VASECTOMY    . WISDOM TOOTH EXTRACTION      Current Outpatient Prescriptions  Medication Sig Dispense Refill  . ibuprofen (ADVIL,MOTRIN) 200 MG tablet Take 200 mg by mouth every 6 (six) hours as needed.     No current facility-administered medications for this visit.     Allergies as of 06/22/2016 - Review Complete 06/14/2016  Allergen Reaction Noted  . Onion  02/23/2014  . Penicillins Rash 11/05/2013  Family History  Problem Relation Age of Onset  . COPD Mother   . Hiatal hernia Mother   . Asthma Sister   . Heart disease Neg Hx   . Hyperlipidemia Neg Hx   . Hypertension Neg Hx   . Stroke Neg Hx   . Cancer Neg Hx     Social History   Social History  . Marital status: Married    Spouse name: N/A  . Number of children: N/A  . Years of education: N/A   Occupational History  . Not on file.   Social History Main Topics  . Smoking status: Never Smoker  . Smokeless tobacco: Not on file  . Alcohol use Yes     Comment: social  . Drug use: No  . Sexual activity: Yes    Birth control/  protection: Surgical   Other Topics Concern  . 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[employer], 2 sons, 2 dogs, Jewish    Review of Systems:    Constitutional: No weight loss, fever, chills, weakness or fatigue HEENT: Eyes: No change in vision               Ears, Nose, Throat:  No change in hearing Cardiovascular: No chest pain Respiratory: No SOB Gastrointestinal: See HPI and otherwise negative Genitourinary: No dysuria or change in urinary frequency Neurological: No headache Musculoskeletal: No new muscle or joint pain Hematologic: No bleeding  Psychiatric: No history of depression or anxiety   Physical Exam:  Vital signs: BP 126/80   Pulse 80   Ht 6\' 3"  (1.905 m)   Wt (!) 386 lb (175.1 kg)   BMI 48.25 kg/m    Constitutional:   Pleasant obese Caucasian male appears to be in NAD, Well developed, Well nourished, alert and cooperative Head:  Normocephalic and atraumatic. Eyes:   PEERL, EOMI. No icterus. Conjunctiva pink. Ears:  Normal auditory acuity. Neck:  Supple Throat: Oral cavity and pharynx without inflammation, swelling or lesion.  Respiratory: Respirations even and unlabored. Lungs clear to auscultation bilaterally.   No wheezes, crackles, or rhonchi.  Cardiovascular: Normal S1, S2. No MRG. Regular rate and rhythm. No peripheral edema, cyanosis or pallor.  Gastrointestinal:  Rotund Soft, nondistended, nontender. No rebound or guarding. Normal bowel sounds. No appreciable masses or hepatomegaly. Rectal:  Not performed.  Msk:  Symmetrical without gross deformities. Without edema, no deformity or joint abnormality.  Neurologic:  Alert and  oriented x4;  grossly normal neurologically.  Skin:   Dry and intact without significant lesions or rashes. Psychiatric:  Demonstrates good judgement and reason without abnormal affect or behaviors.  No recent labs.  Barium Swallow Study was performed in 2015 IMPRESSION: 1. Small hiatal hernia with  mild gastroesophageal reflux. 2. Extrinsic indentation upon the left lateral lower cervical esophagus. Question enlarged left thyroid. Consider ultrasound of the thyroid to assess further. 3. Mild tertiary contractions in the mid and distal esophagus  Assessment: 1. Dysphagia: Worsened over the past 6 months, 4 episodes per week, food gets stuck and patient regurgitates with accompanied panic, swallow study in 2015 showed mild gastroesophageal reflux,the patient has no symptoms, as well as extrinsic indentation upon the esophagus with the thyroid, patient was also found to have a 5 mm thyroid nodule at that time, patient also had mild tertiary contractions in the mid and distal esophagus; consider dysmotility versus structural abnormality including esophageal ring versus web versus mass versus stricture versus extrinsic compression 2. Screening colorectal  cancer: Patient is due for screening colonoscopy  Plan: 1. Appears patient was already told to return to his PCP regarding fasting labs for further eval of his thyroid nodule which was identified on ultrasound in 2015, recommend he does complete follow-up 2. Recommend the patient proceed with an EGD with dilation for further evaluation. Discussed risks, benefits, limitations and alternatives and the patient agrees to proceed. This will need to be scheduled with Dr. Marina Goodell as he is the supervising physician this morning at the hospital due to patient's weight being over 350 pounds 3. Reviewed anti-dysphagia measures including taking small bites, drinking sips of water between bites, chewing his food well, avoiding distraction while eating and the chin tuck technique. He verbalized understanding. 4. Patient is also due for a screening colonoscopy. He would like to wait on this procedure until later next year. 5. Patient to follow in clinic per Dr. Lamar Sprinkles recommendations after time of procedure  Hyacinth Meeker, PA-C [facility] 06/22/2016, 11:22 AM  Cc: Avanell Shackleton, NP

## 2016-06-22 NOTE — Progress Notes (Signed)
Reviewed. Recommend barium esophagram with tablet and then likely EGD at the hospital.

## 2016-06-22 NOTE — Addendum Note (Signed)
Addended byDerry Skill: Tomas Schamp K on: 06/22/2016 03:33 PM   Modules accepted: Orders, SmartSet

## 2016-06-22 NOTE — Patient Instructions (Addendum)
We will call you with a date for the Endoscopy with Dr. Yancey FlemingsJohn Hahn. We have provided you with directions without a date and time. Have this with you when we call with the date.  Patrick Salemi will go over the instructions with you.

## 2016-06-22 NOTE — Telephone Encounter (Signed)
Called the patient to advise we did schedule him with Dr. Yancey FlemingsJohn Hahn for [facility]-2016 at 9:30am. Nathaniel FlesherWent over instructions with him.  I also scheduled him for the Barium Esophagram with tablet for 06-29-2017 , [facility] for 11:30 am.  Patient verbally understanding instructions.

## 2016-06-29 ENCOUNTER — Ambulatory Visit (HOSPITAL_COMMUNITY)
Admission: RE | Admit: 2016-06-29 | Discharge: 2016-06-29 | Disposition: A | Discharge status: Home / Self Care | Payer: BLUE CROSS/BLUE SHIELD | Source: Ambulatory Visit | Attending: Physician Assistant | Admitting: Physician Assistant

## 2016-06-29 DIAGNOSIS — R1319 Other dysphagia: Secondary | ICD-10-CM | POA: Diagnosis not present

## 2016-06-29 DIAGNOSIS — K449 Diaphragmatic hernia without obstruction or gangrene: Secondary | ICD-10-CM | POA: Diagnosis not present

## 2016-06-29 DIAGNOSIS — K222 Esophageal obstruction: Secondary | ICD-10-CM | POA: Diagnosis not present

## 2016-06-29 DIAGNOSIS — K224 Dyskinesia of esophagus: Secondary | ICD-10-CM | POA: Insufficient documentation

## 2016-06-29 DIAGNOSIS — R1310 Dysphagia, unspecified: Secondary | ICD-10-CM | POA: Diagnosis present

## 2016-07-10 ENCOUNTER — Encounter: Payer: Self-pay | Admitting: Medical

## 2016-07-10 ENCOUNTER — Ambulatory Visit (INDEPENDENT_AMBULATORY_CARE_PROVIDER_SITE_OTHER): Payer: BLUE CROSS/BLUE SHIELD | Admitting: Medical

## 2016-07-10 ENCOUNTER — Telehealth: Payer: Self-pay | Admitting: Medical

## 2016-07-10 VITALS — BP 132/80 | HR 75 | Ht 75.0 in | Wt 380.2 lb

## 2016-07-10 DIAGNOSIS — R945 Abnormal results of liver function studies: Secondary | ICD-10-CM

## 2016-07-10 DIAGNOSIS — Z125 Encounter for screening for malignant neoplasm of prostate: Secondary | ICD-10-CM

## 2016-07-10 DIAGNOSIS — Z1211 Encounter for screening for malignant neoplasm of colon: Secondary | ICD-10-CM | POA: Diagnosis not present

## 2016-07-10 DIAGNOSIS — R1310 Dysphagia, unspecified: Secondary | ICD-10-CM | POA: Insufficient documentation

## 2016-07-10 DIAGNOSIS — R7301 Impaired fasting glucose: Secondary | ICD-10-CM | POA: Insufficient documentation

## 2016-07-10 DIAGNOSIS — R7989 Other specified abnormal findings of blood chemistry: Secondary | ICD-10-CM | POA: Diagnosis not present

## 2016-07-10 DIAGNOSIS — Z0000 Encounter for general adult medical examination without abnormal findings: Secondary | ICD-10-CM | POA: Diagnosis not present

## 2016-07-10 DIAGNOSIS — E041 Nontoxic single thyroid nodule: Secondary | ICD-10-CM | POA: Insufficient documentation

## 2016-07-10 LAB — LIPID PANEL
CHOL/HDL RATIO: 6.2 ratio — AB (ref <5.0)
Cholesterol: 197 mg/dL (ref <200)
HDL: 32 mg/dL — AB (ref >40)
LDL CALC: 133 mg/dL — AB (ref <100)
Triglycerides: 158 mg/dL — ABNORMAL HIGH (ref <150)
VLDL: 32 mg/dL — AB (ref <30)

## 2016-07-10 LAB — CBC WITH DIFFERENTIAL/PLATELET
BASOS PCT: 1 %
Basophils Absolute: 99 cells/uL (ref 0–200)
Eosinophils Absolute: 396 cells/uL (ref 15–500)
Eosinophils Relative: 4 %
HEMATOCRIT: 45.4 % (ref 38.5–50.0)
Hemoglobin: 15.9 g/dL (ref 13.2–17.1)
LYMPHS PCT: 22 %
Lymphs Abs: 2178 cells/uL (ref 850–3900)
MCH: 31.1 pg (ref 27.0–33.0)
MCHC: 35 g/dL (ref 32.0–36.0)
MCV: 88.8 fL (ref 80.0–100.0)
MONOS PCT: 6 %
MPV: 10.3 fL (ref 7.5–12.5)
Monocytes Absolute: 594 cells/uL (ref 200–950)
NEUTROS PCT: 67 %
Neutro Abs: 6633 cells/uL (ref 1500–7800)
PLATELETS: 296 10*3/uL (ref 140–400)
RBC: 5.11 MIL/uL (ref 4.20–5.80)
RDW: 12.9 % (ref 11.0–15.0)
WBC: 9.9 10*3/uL (ref 4.0–10.5)

## 2016-07-10 LAB — COMPREHENSIVE METABOLIC PANEL
ALK PHOS: 84 U/L (ref 40–115)
ALT: 75 U/L — AB (ref 9–46)
AST: 44 U/L — ABNORMAL HIGH (ref 10–35)
Albumin: 4.1 g/dL (ref 3.6–5.1)
BUN: 10 mg/dL (ref 7–25)
CALCIUM: 8.9 mg/dL (ref 8.6–10.3)
CHLORIDE: 102 mmol/L (ref 98–110)
CO2: 25 mmol/L (ref 20–31)
Creat: 0.78 mg/dL (ref 0.70–1.33)
GLUCOSE: 128 mg/dL — AB (ref 65–99)
POTASSIUM: 3.9 mmol/L (ref 3.5–5.3)
Sodium: 137 mmol/L (ref 135–146)
Total Bilirubin: 0.6 mg/dL (ref 0.2–1.2)
Total Protein: 7 g/dL (ref 6.1–8.1)

## 2016-07-10 LAB — HEMOGLOBIN A1C
HEMOGLOBIN A1C: 6.1 % — AB (ref <5.7)
Mean Plasma Glucose: 128 mg/dL

## 2016-07-10 LAB — PSA: PSA: 0.7 ng/mL (ref <=4.0)

## 2016-07-10 LAB — TSH: TSH: 1.17 m[IU]/L (ref 0.40–4.50)

## 2016-07-10 LAB — T4, FREE: Free T4: 1.4 ng/dL (ref 0.8–1.8)

## 2016-07-10 NOTE — Progress Notes (Signed)
Subjective:   HPI  Nathaniel Hahn is a 51 y.o. male who presents for a complete physical.  Accompanied by wife.   Been doing well, except having some issues with throat, swallowing.  He saw Chip Boer, NP here recently for the swallow issue.  Having EGD soon with Dr. Marina Goodell, [facility].  Had swallow test, and there was some issues with solids.   Muscles weren't contracting properly.  Had fracture last February, took a while to recover from this.    When I last saw him 2015 for physical, he had impaired glucose lab, elevated LFT and thyroid nodule.    In general other than the swallow issues, he denies polyuria, polydipsia, no vision changes, no chest pain, no SOB.   He hasn't been exercising much but has plans to do better.  Has been working on better diet of late.    He still gets chronic numbness in right upper thigh laterally and behind right knee.   This was present 2015.  Denies sitting cross legged.   Reviewed their medical, surgical, family, social, medication, and allergy history and updated chart as appropriate.  Past Medical History:  Diagnosis Date  . Allergy   . Distal radius fracture, right 08/2015   FOOSH type injury  . Hearing loss    history of working around loud machinery  . History of sinusitis    yearly  . Leg pain, diffuse   . Meningitis    6 months old  . Plantar fasciitis     Past Surgical History:  Procedure Laterality Date  . APPENDECTOMY    . OPEN REDUCTION INTERNAL FIXATION (ORIF) DISTAL RADIAL FRACTURE Right 08/03/2015   Procedure: OPEN REDUCTION INTERNAL FIXATION (ORIF) RIGHT DISTAL RADIUS FRACTURE;  Surgeon: Betha Loa, MD;  Location: [facility];  Service: Orthopedics;  Laterality: Right;  Marland Kitchen VASECTOMY    . WISDOM TOOTH EXTRACTION      Social History   Social History  . Marital status: Married    Spouse name: N/A  . Number of children: N/A  . Years of education: N/A   Occupational History  . Not on file.   Social History Main  Topics  . Smoking status: Never Smoker  . Smokeless tobacco: Never Used  . Alcohol use Yes     Comment: social  . Drug use: No  . Sexual activity: Yes    Partners: Female    Birth control/ protection: Surgical   Other Topics Concern  . Not on file   Social History Narrative   Married, in between jobs, former Art therapist at [employer], 2 sons, 2 dogs, Jewish.  07/2016    Family History  Problem Relation Age of Onset  . COPD Mother   . Hiatal hernia Mother   . Colon polyps Mother   . Asthma Sister   . Heart disease Neg Hx   . Hyperlipidemia Neg Hx   . Hypertension Neg Hx   . Stroke Neg Hx   . Cancer Neg Hx   . Colon cancer Neg Hx      Current Outpatient Prescriptions:  .  ibuprofen (ADVIL,MOTRIN) 200 MG tablet, Take 200 mg by mouth every 6 (six) hours as needed., Disp: , Rfl:  .  Multiple Vitamin (MULTIVITAMIN) tablet, Take 1 tablet by mouth daily., Disp: , Rfl:   Allergies  Allergen Reactions  . Onion   . Penicillins Rash    Review of Systems Constitutional: -fever, -chills, -sweats, -unexpected weight change, -decreased appetite, -fatigue Allergy: -sneezing, -itching, -  congestion Dermatology: -changing moles, --rash, -lumps ENT: -runny nose, -ear pain, -sore throat, -hoarseness, -sinus pain, -teeth pain, - ringing in ears, -hearing loss, -nosebleeds Cardiology: -chest pain, -palpitations, -swelling, -difficulty breathing when lying flat, -waking up short of breath Respiratory: -cough, -shortness of breath, -difficulty breathing with exercise or exertion, -wheezing, -coughing up blood Gastroenterology: -abdominal pain, -nausea, -vomiting, -diarrhea, -constipation, -blood in stool, -changes in bowel movement, +difficulty swallowing or eating Hematology: -bleeding, -bruising  Musculoskeletal: -joint aches, -muscle aches, -joint swelling, -back pain, -neck pain, -cramping, -changes in gait Ophthalmology: denies vision changes, eye redness, itching, discharge Urology:  -burning with urination, -difficulty urinating, -blood in urine, -urinary frequency, -urgency, -incontinence Neurology: -headache, -weakness, -tingling, +numbness, -memory loss, -falls, -dizziness Psychology: -depressed mood, -agitation, +sleep problems      Objective:   Physical Exam  BP 132/80   Pulse 75   Ht 6\' 3"  (1.905 m)   Wt (!) 380 lb 3.2 oz (172.5 kg)   SpO2 96%   BMI 47.52 kg/m   General appearance: alert, no distress, WD/WN, morbidly obese white male Skin: scattered macules, throughout torso, left upper back with pedunculated pink with central brownish colored skin tag, right anterior lower leg with a few localized bruises, no worrisome lesions HEENT: normocephalic, conjunctiva/corneas normal, sclerae anicteric, PERRLA, EOMi, nares patent, no discharge or erythema, pharynx normal Oral cavity: MMM, tongue normal, teeth in good repair Neck: supple, no lymphadenopathy, no thyromegaly, no masses, normal ROM, on bruits Chest: non tender, normal shape and expansion Heart: RRR, normal S1, S2, no murmurs Lungs: CTA bilaterally, no wheezes, rhonchi, or rales Abdomen: +bs, soft, non tender, non distended, no masses, no hepatomegaly, no splenomegaly, no bruits Back: non tender, normal ROM, no scoliosis Musculoskeletal: upper extremities non tender, no obvious deformity, normal ROM throughout, lower extremities non tender, no obvious deformity, normal ROM throughout Extremities: mild varicosities of LE, otherwise no edema, no cyanosis, no clubbing Pulses: 2+ symmetric, upper and lower extremities, normal cap refill Neurological: right upper thigh/lateral thigh decreased sensation compared to rest of LE, otherwise alert, oriented x 3, CN2-12 intact, strength normal upper extremities and lower extremities, sensation normal throughout, DTRs 2+ throughout, no cerebellar signs, gait normal Psychiatric: normal affect, behavior normal, pleasant  GU: normal male external genitalia,  circumcised, nontender, no masses, no hernia, no lymphadenopathy Rectal: deferred   Assessment and Plan :    Encounter Diagnoses  Name Primary?  . Encounter for health maintenance examination in adult Yes  . Impaired fasting blood sugar   . Elevated LFTs   . Special screening for malignant neoplasms, colon   . Dysphagia, unspecified type   . Screening for prostate cancer   . Thyroid nodule     Physical exam - discussed healthy lifestyle, diet, exercise, preventative care, vaccinations, and addressed their concerns.   Advise weight loss through diet and exercise changes. Advise that we need to get aggressive on his weight loss We discussed his symptoms and exam suggesting meralgia paresthetica and causes.   Advised he see dentist and eye doctor yearly. Hearing exam normal F/u for EGD soon as planned, but advised he call and ask them to add the colonospcy as well. Thyroid nodule - none palpated today. Reviewed back to 2015 neck ultrasound.   Osteoporosis questionnaire - risks include history of fracture last year, sedentary, fall.  Consider bone density scan. Follow-up pending studies   Nickolas was seen today for physical.  Diagnoses and all orders for this visit:  Encounter for health maintenance examination in adult -  Comprehensive metabolic panel -     Lipid panel -     CBC with Differential/Platelet -     PSA -     TSH -     Hemoglobin A1c -     T4, free  Impaired fasting blood sugar  Elevated LFTs -     Comprehensive metabolic panel  Special screening for malignant neoplasms, colon  Dysphagia, unspecified type  Screening for prostate cancer  Thyroid nodule -     TSH -     T4, free

## 2016-07-11 NOTE — Telephone Encounter (Signed)
error 

## 2016-07-12 ENCOUNTER — Other Ambulatory Visit: Payer: Self-pay | Admitting: Medical

## 2016-07-12 MED ORDER — PRAVASTATIN SODIUM 20 MG PO TABS: 20.0000 mg | ORAL_TABLET | Freq: Every day | ORAL | 2 refills | Status: DC

## 2016-07-12 MED ORDER — METFORMIN HCL 500 MG PO TABS: 500.0000 mg | ORAL_TABLET | Freq: Two times a day (BID) | ORAL | 2 refills | Status: DC

## 2016-07-13 ENCOUNTER — Telehealth: Payer: Self-pay | Admitting: Internal Medicine

## 2016-07-13 ENCOUNTER — Other Ambulatory Visit: Payer: Self-pay | Admitting: Family Medicine

## 2016-07-13 MED ORDER — METFORMIN HCL 500 MG PO TABS: 500.0000 mg | ORAL_TABLET | Freq: Two times a day (BID) | ORAL | 2 refills | Status: DC

## 2016-07-13 MED ORDER — PRAVASTATIN SODIUM 20 MG PO TABS: 20.0000 mg | ORAL_TABLET | Freq: Every day | ORAL | 2 refills | Status: DC

## 2016-07-13 NOTE — Telephone Encounter (Signed)
Spoke with pt and he would like to have colon and EGD done at the hospital on 07/27/16. [facility] and they can [facility] the colon. Please advise if ok.

## 2016-07-14 NOTE — Telephone Encounter (Signed)
I prefer taking care of the esophageal stricture alone. We can arrange routine colonoscopy at some other point in the future

## 2016-07-25 ENCOUNTER — Encounter (HOSPITAL_COMMUNITY): Payer: Self-pay | Admitting: *Deleted

## 2016-07-27 ENCOUNTER — Encounter (HOSPITAL_COMMUNITY): Payer: Self-pay | Admitting: Certified Registered Nurse Anesthetist

## 2016-07-27 ENCOUNTER — Ambulatory Visit (HOSPITAL_COMMUNITY)
Admission: RE | Admit: 2016-07-27 | Discharge: 2016-07-27 | Disposition: A | Discharge status: Home / Self Care | Payer: BLUE CROSS/BLUE SHIELD | Source: Ambulatory Visit | Attending: Internal Medicine | Admitting: Internal Medicine

## 2016-07-27 ENCOUNTER — Ambulatory Visit (HOSPITAL_COMMUNITY): Payer: BLUE CROSS/BLUE SHIELD | Admitting: Certified Registered Nurse Anesthetist

## 2016-07-27 ENCOUNTER — Encounter (HOSPITAL_COMMUNITY)
Admission: RE | Disposition: A | Discharge status: Home / Self Care | Payer: Self-pay | Source: Ambulatory Visit | Attending: Internal Medicine

## 2016-07-27 DIAGNOSIS — K2100 Gastro-esophageal reflux disease with esophagitis, without bleeding: Secondary | ICD-10-CM

## 2016-07-27 DIAGNOSIS — E119 Type 2 diabetes mellitus without complications: Secondary | ICD-10-CM | POA: Diagnosis not present

## 2016-07-27 DIAGNOSIS — Z79899 Other long term (current) drug therapy: Secondary | ICD-10-CM | POA: Diagnosis not present

## 2016-07-27 DIAGNOSIS — K222 Esophageal obstruction: Secondary | ICD-10-CM

## 2016-07-27 DIAGNOSIS — R1314 Dysphagia, pharyngoesophageal phase: Secondary | ICD-10-CM | POA: Diagnosis not present

## 2016-07-27 DIAGNOSIS — R1310 Dysphagia, unspecified: Secondary | ICD-10-CM | POA: Insufficient documentation

## 2016-07-27 DIAGNOSIS — R933 Abnormal findings on diagnostic imaging of other parts of digestive tract: Secondary | ICD-10-CM | POA: Insufficient documentation

## 2016-07-27 HISTORY — DX: Type 2 diabetes mellitus without complications: E11.9

## 2016-07-27 HISTORY — PX: ESOPHAGOGASTRODUODENOSCOPY (EGD) WITH PROPOFOL: SHX5813

## 2016-07-27 LAB — GLUCOSE, CAPILLARY: Glucose-Capillary: 108 mg/dL — ABNORMAL HIGH (ref 65–99)

## 2016-07-27 SURGERY — ESOPHAGOGASTRODUODENOSCOPY (EGD) WITH PROPOFOL
Anesthesia: Monitor Anesthesia Care

## 2016-07-27 MED ORDER — LACTATED RINGERS IV SOLN
INTRAVENOUS | Status: DC
  Administered 2016-07-27: 1000 mL via INTRAVENOUS

## 2016-07-27 MED ORDER — LIDOCAINE 2% (20 MG/ML) 5 ML SYRINGE
INTRAMUSCULAR | Status: DC | PRN
  Administered 2016-07-27: 150 mg via INTRAVENOUS

## 2016-07-27 MED ORDER — LIDOCAINE 2% (20 MG/ML) 5 ML SYRINGE
INTRAMUSCULAR | Status: AC
  Filled 2016-07-27: qty 10

## 2016-07-27 MED ORDER — PROPOFOL 10 MG/ML IV BOLUS
INTRAVENOUS | Status: DC | PRN
  Administered 2016-07-27: 30 mg via INTRAVENOUS
  Administered 2016-07-27: 20 mg via INTRAVENOUS
  Administered 2016-07-27: 70 mg via INTRAVENOUS

## 2016-07-27 MED ORDER — PROPOFOL 10 MG/ML IV BOLUS
INTRAVENOUS | Status: AC
  Filled 2016-07-27: qty 40

## 2016-07-27 MED ORDER — PROPOFOL 500 MG/50ML IV EMUL
INTRAVENOUS | Status: DC | PRN
  Administered 2016-07-27: 150 ug/kg/min via INTRAVENOUS

## 2016-07-27 MED ORDER — SODIUM CHLORIDE 0.9 % IV SOLN: INTRAVENOUS | Status: DC

## 2016-07-27 SURGICAL SUPPLY — 24 items

## 2016-07-27 NOTE — H&P (Signed)
HISTORY OF PRESENT ILLNESS:  Nathaniel Hahn is a 51 y.o. male was evaluated in the office for dysphagia. See that dictation for details. Esophagram reveals distal esophageal stricture or ring. Solid food dysphagia over the past 2 years but has worsened in recent months. Now for endoscopy with esophageal dilation and possible initiation of PPI therapy. We discussed this today  REVIEW OF SYSTEMS:  All non-GI ROS negative except for  Past Medical History:  Diagnosis Date  . Allergy   . Diabetes mellitus without complication (HCC)   . Distal radius fracture, right 08/2015   FOOSH type injury  . Hearing loss    history of working around loud machinery  . History of sinusitis    yearly  . Leg pain, diffuse   . Meningitis    6 months old  . Plantar fasciitis     Past Surgical History:  Procedure Laterality Date  . APPENDECTOMY    . OPEN REDUCTION INTERNAL FIXATION (ORIF) DISTAL RADIAL FRACTURE Right 08/03/2015   Procedure: OPEN REDUCTION INTERNAL FIXATION (ORIF) RIGHT DISTAL RADIUS FRACTURE;  Surgeon: Betha LoaKevin Kuzma, MD;  Location: [facility];  Service: Orthopedics;  Laterality: Right;  Marland Kitchen. VASECTOMY    . WISDOM TOOTH EXTRACTION      Social History Nathaniel CoddingCharles Hahn  reports that he has never smoked. He has never used smokeless tobacco. He reports that he drinks alcohol. He reports that he does not use drugs.  family history includes Asthma in his sister; COPD in his mother; Colon polyps in his mother; Hiatal hernia in his mother.  Allergies  Allergen Reactions  . Onion Nausea And Vomiting  . Penicillins Rash    Has patient had a PCN reaction causing immediate rash, facial/tongue/throat swelling, SOB or lightheadedness with hypotension: {unknown Has patient had a PCN reaction causing severe rash involving mucus membranes or skin necrosis: {no Has patient had a PCN reaction that required hospitalization {no Has patient had a PCN reaction occurring within the last 10 years:  {no If all of the above answers are "NO", then may proceed with Cephalosporin use.       PHYSICAL EXAMINATION: Vital signs: BP 119/63   Pulse 83   Temp 97.7 F (36.5 C) (Oral)   Resp 16   Ht 6\' 3"  (1.905 m)   Wt (!) 372 lb (168.7 kg)   SpO2 96%   BMI 46.50 kg/m  General: Well-developed, well-nourished, no acute distress HEENT: Sclerae are anicteric, conjunctiva pink. Oral mucosa intact Lungs: Clear Heart: Regular Abdomen: soft, obese, nontender, nondistended, no obvious ascites, no peritoneal signs, normal bowel sounds. No organomegaly. Extremities: No edema Psychiatric: alert and oriented x3. Cooperative   ASSESSMENT:  #1. Intermittent solid food dysphagia secondary to distal esophageal ring or stricture   PLAN:  #1. Upper endoscopy with esophageal dilation.The nature of the procedure, as well as the risks, benefits, and alternatives were carefully and thoroughly reviewed with the patient. Ample time for discussion and questions allowed. The patient understood, was satisfied, and agreed to proceed. #2. Possible institution of PPI post procedure

## 2016-07-27 NOTE — Op Note (Signed)
[facility] Patient Name: Nathaniel Hahn Procedure Date: 07/27/2016 MRN: [id number] Attending MD: Wilhemina Bonito. Marina Goodell , MD Date of Birth: 1966/01/18 CSN: [id number] Age: 51 Admit Type: Outpatient Procedure:                Upper GI endoscopy, with balloon dilation of the                            esophagus Indications:              Dysphagia, Abnormal cine-esophagram Providers:                Wilhemina Bonito. Marina Goodell, MD, Anthony Sar, RN, [facility], Technician, Randon Goldsmith, CRNA Referring MD:             Zorita Pang. Suezanne Jacquet, NP Medicines:                Monitored Anesthesia Care Complications:            No immediate complications. Estimated Blood Loss:     Estimated blood loss: trivial. Procedure:                Pre-Anesthesia Assessment:                           - Prior to the procedure, a History and Physical                            was performed, and patient medications and                            allergies were reviewed. The patient's tolerance of                            previous anesthesia was also reviewed. The risks                            and benefits of the procedure and the sedation                            options and risks were discussed with the patient.                            All questions were answered, and informed consent                            was obtained. Prior Anticoagulants: The patient has                            taken no previous anticoagulant or antiplatelet                            agents. ASA Grade Assessment: II - A patient with  mild systemic disease. After reviewing the risks                            and benefits, the patient was deemed in                            satisfactory condition to undergo the procedure.                           After obtaining informed consent, the endoscope was                            passed under direct vision. Throughout the                     procedure, the patient's blood pressure, pulse, and                            oxygen saturations were monitored continuously. The                            Endoscope was introduced through the mouth, and                            advanced to the second part of duodenum. The upper                            GI endoscopy was accomplished without difficulty.                            The patient tolerated the procedure well. Scope In: Scope Out: Findings:      One moderate benign-appearing, intrinsic stenosis was found 44 cm from       the incisors. This measured 1.4 cm (inner diameter). There was       associated esophagitis as manifested by edema and friability. A TTS       dilator was passed through the scope. Dilation with an 18-19-20 mm       balloon dilator was performed to 18 mm. The dilation site was examined       and showed moderate improvement in luminal narrowing. Estimated blood       loss was minimal.      The exam of the esophagus was otherwise normal.      The stomach was normal. Small hiatal hernia present.      The examined duodenum was normal.      The cardia and gastric fundus were normal on retroflexion. Impression:               1. GERD complicated by esophagitis and peptic                            stricture status post dilation Moderate Sedation:      none Recommendation:           1. Nothing by mouth 1 hour then clear liquids for 2  hours then soft foods until tomorrow. Resume                            previous diet tomorrow                           2. Obtain Prilosec (omeprazole) over-the-counter.                            Take 20 mg daily, every day.                           3. Please make office follow-up appointment with                            Dr. Marina Hahn in about 6 weeks. Procedure Code(s):        --- Professional ---                           [phone], Esophagogastroduodenoscopy, flexible,                             transoral; with transendoscopic balloon dilation of                            esophagus (less than 30 mm diameter) Diagnosis Code(s):        --- Professional ---                           K22.2, Esophageal obstruction                           R13.10, Dysphagia, unspecified                           R93.3, Abnormal findings on diagnostic imaging of                            other parts of digestive tract CPT copyright 2016 [employer]. All rights reserved. The codes documented in this report are preliminary and upon coder review may  be revised to meet current compliance requirements. Wilhemina BonitoJohn N. Marina GoodellPerry, MD 07/27/2016 10:10:45 AM This report has been signed electronically. Number of Addenda: 0

## 2016-07-27 NOTE — Discharge Instructions (Signed)

## 2016-07-27 NOTE — Anesthesia Preprocedure Evaluation (Signed)
Anesthesia Evaluation  Patient identified by MRN, date of birth, ID band Patient awake    Reviewed: Allergy & Precautions, H&P , Patient's Chart, lab work & pertinent test results, reviewed documented beta blocker date and time   Airway Mallampati: II  TM Distance: >3 FB Neck ROM: full    Dental no notable dental hx.    Pulmonary    Pulmonary exam normal breath sounds clear to auscultation       Cardiovascular  Rhythm:regular Rate:Normal     Neuro/Psych    GI/Hepatic   Endo/Other  diabetes, Type 2  Renal/GU      Musculoskeletal   Abdominal   Peds  Hematology   Anesthesia Other Findings   Reproductive/Obstetrics                             Anesthesia Physical Anesthesia Plan  ASA: III  Anesthesia Plan: MAC   Post-op Pain Management:    Induction: Intravenous  Airway Management Planned: Mask and Natural Airway  Additional Equipment:   Intra-op Plan:   Post-operative Plan:   Informed Consent: I have reviewed the patients History and Physical, chart, labs and discussed the procedure including the risks, benefits and alternatives for the proposed anesthesia with the patient or authorized representative who has indicated his/her understanding and acceptance.   Dental Advisory Given  Plan Discussed with: CRNA and Surgeon  Anesthesia Plan Comments: (Discussed sedation and potential to need to place airway or ETT if warranted by clinical changes intra-operatively. We will start procedure as MAC.)        Anesthesia Quick Evaluation

## 2016-07-27 NOTE — Anesthesia Postprocedure Evaluation (Signed)
Anesthesia Post Note  Patient: Nathaniel Hahn  Procedure(s) Performed: Procedure(s) (LRB): ESOPHAGOGASTRODUODENOSCOPY (EGD) WITH PROPOFOL; with Dilation (N/A) COLONOSCOPY WITH PROPOFOL (N/A)  Patient location during evaluation: PACU Anesthesia Type: MAC Level of consciousness: awake and alert Pain management: pain level controlled Vital Signs Assessment: post-procedure vital signs reviewed and stable Respiratory status: spontaneous breathing, nonlabored ventilation, respiratory function stable and patient connected to nasal cannula oxygen Cardiovascular status: stable and blood pressure returned to baseline Anesthetic complications: no       Last Vitals:  Vitals:   07/27/16 1002 07/27/16 1030  BP: (!) 94/34 (!) 115/52  Pulse: 81   Resp: 14   Temp: 36.9 C     Last Pain:  Vitals:   07/27/16 1002  TempSrc: Oral                 [name] EDWARD

## 2016-07-27 NOTE — Transfer of Care (Signed)
Immediate Anesthesia Transfer of Care Note  Patient: Nathaniel Hahn  Procedure(s) Performed: Procedure(s): ESOPHAGOGASTRODUODENOSCOPY (EGD) WITH PROPOFOL; with Dilation (N/A) COLONOSCOPY WITH PROPOFOL (N/A)  Patient Location: PACU  Anesthesia Type:MAC  Level of Consciousness: Patient easily awoken, sedated, comfortable, cooperative, following commands, responds to stimulation.   Airway & Oxygen Therapy: Patient spontaneously breathing, ventilating well, oxygen via simple oxygen mask.  Post-op Assessment: Report given to PACU RN, vital signs reviewed and stable, moving all extremities.   Post vital signs: Reviewed and stable.  Complications: No apparent anesthesia complications Last Vitals:  Vitals:   07/27/16 0827  BP: 119/63  Pulse: 83  Resp: 16  Temp: 36.5 C    Last Pain:  Vitals:   07/27/16 0827  TempSrc: Oral         Complications: No apparent anesthesia complications

## 2016-07-28 ENCOUNTER — Encounter (HOSPITAL_COMMUNITY): Payer: Self-pay | Admitting: Internal Medicine

## 2016-08-09 ENCOUNTER — Encounter (HOSPITAL_COMMUNITY): Payer: Self-pay | Admitting: Internal Medicine

## 2016-08-16 ENCOUNTER — Encounter: Payer: Self-pay | Admitting: Medical

## 2016-08-16 ENCOUNTER — Ambulatory Visit (INDEPENDENT_AMBULATORY_CARE_PROVIDER_SITE_OTHER): Payer: BLUE CROSS/BLUE SHIELD | Admitting: Medical

## 2016-08-16 VITALS — BP 126/82 | HR 71 | Wt 372.0 lb

## 2016-08-16 DIAGNOSIS — R7989 Other specified abnormal findings of blood chemistry: Secondary | ICD-10-CM

## 2016-08-16 DIAGNOSIS — E785 Hyperlipidemia, unspecified: Secondary | ICD-10-CM | POA: Diagnosis not present

## 2016-08-16 DIAGNOSIS — K2100 Gastro-esophageal reflux disease with esophagitis, without bleeding: Secondary | ICD-10-CM

## 2016-08-16 DIAGNOSIS — R7301 Impaired fasting glucose: Secondary | ICD-10-CM

## 2016-08-16 DIAGNOSIS — R945 Abnormal results of liver function studies: Secondary | ICD-10-CM

## 2016-08-16 NOTE — Progress Notes (Signed)
Subjective: Chief Complaint  Patient presents with  . follow up    follow up dm    Here for f/u.  Here to f/u on pre-diabetes, lipids, weight, liver test abnormal last visit 07/10/16.  Last visit about 4-5 weeks ago we had him start Pravachol, metformin to help lower cholesterol and sugars given hyperlipidemia and borderline diabetes.  We counseled on lifestyle changes.   Since last visit eating better, walking more, trying to make improvements.   Has questions about multivitamin.  Taking Metformin BID, taking Pravachol. Since last visit had EGD, and they advised he begin omeprazole.  No other aggravating or relieving factors. No other complaint.   Past Medical History:  Diagnosis Date  . Allergy   . Diabetes mellitus without complication (HCC)   . Distal radius fracture, right 08/2015   FOOSH type injury  . Hearing loss    history of working around loud machinery  . History of sinusitis    yearly  . Leg pain, diffuse   . Meningitis    6 months old  . Plantar fasciitis    Current Outpatient Prescriptions on File Prior to Visit  Medication Sig Dispense Refill  . ibuprofen (ADVIL,MOTRIN) 200 MG tablet Take 200 mg by mouth every 6 (six) hours as needed for moderate pain.     . metFORMIN (GLUCOPHAGE) 500 MG tablet Take 1 tablet (500 mg total) by mouth 2 (two) times daily with a meal. 1 tablet po BID for diabetes 60 tablet 2  . pravastatin (PRAVACHOL) 20 MG tablet Take 1 tablet (20 mg total) by mouth daily. 30 tablet 2  . Multiple Vitamin (MULTIVITAMIN) tablet Take 1 tablet by mouth daily.     No current facility-administered medications on file prior to visit.    ROS as in subjective   Objective: BP 126/82   Pulse 71   Wt (!) 372 lb (168.7 kg)   SpO2 95%   BMI 46.50 kg/m   Wt Readings from Last 3 Encounters:  08/16/16 (!) 372 lb (168.7 kg)  07/27/16 (!) 372 lb (168.7 kg)  07/10/16 (!) 380 lb 3.2 oz (172.5 kg)   Gen: wd, wn, nad     Assessment: Encounter Diagnoses   Name Primary?  . Impaired fasting blood sugar Yes  . Hyperlipidemia, unspecified hyperlipidemia type   . Morbid obesity (HCC)   . Elevated LFTs   . Gastroesophageal reflux disease with esophagitis     Plan Glad to see he is making lifestyle changes, has lost weight, has begun metformin and pravachol.   Plan to return fasting in 3-4 wk for labs. F/u with GI as planned regarding GERD and esophagitis on recent EGD.   Nathaniel Hahn was seen today for follow up.  Diagnoses and all orders for this visit:  Impaired fasting blood sugar -     Hemoglobin A1c; Future -     Comprehensive metabolic panel; Future  Hyperlipidemia, unspecified hyperlipidemia type -     Lipid panel; Future -     Comprehensive metabolic panel; Future  Morbid obesity (HCC)  Elevated LFTs  Gastroesophageal reflux disease with esophagitis

## 2016-09-04 ENCOUNTER — Other Ambulatory Visit: Payer: BLUE CROSS/BLUE SHIELD

## 2016-09-11 ENCOUNTER — Encounter: Payer: Self-pay | Admitting: Internal Medicine

## 2016-09-11 ENCOUNTER — Other Ambulatory Visit: Payer: BLUE CROSS/BLUE SHIELD

## 2016-09-11 ENCOUNTER — Ambulatory Visit (INDEPENDENT_AMBULATORY_CARE_PROVIDER_SITE_OTHER): Payer: BLUE CROSS/BLUE SHIELD | Admitting: Internal Medicine

## 2016-09-11 VITALS — BP 106/74 | HR 72 | Ht 75.0 in | Wt 375.8 lb

## 2016-09-11 DIAGNOSIS — K219 Gastro-esophageal reflux disease without esophagitis: Secondary | ICD-10-CM | POA: Diagnosis not present

## 2016-09-11 DIAGNOSIS — R1310 Dysphagia, unspecified: Secondary | ICD-10-CM

## 2016-09-11 DIAGNOSIS — K222 Esophageal obstruction: Secondary | ICD-10-CM

## 2016-09-11 NOTE — Patient Instructions (Signed)
Continue on your PPI and follow up in one year  Call our office if you want to schedule a colonoscopy

## 2016-09-11 NOTE — Progress Notes (Signed)
HISTORY OF PRESENT ILLNESS:  Nathaniel Hahn is a 51 y.o. male with past medical history as listed below who presents today for follow-up regarding dysphagia secondary to esophageal stricture which she was last seen in the office 06/22/2016. He subsequently underwent upper endoscopy 07/27/2016 at [facility]. He was found to have a distal esophageal stricture measuring 14 mm. Was associated esophagitis. He was balloon dilated to a maximal diameter of 18 mm. Subsequently placed on omeprazole 20 mg daily. He presents today for follow-up. Patient reports complete resolution of dysphagia. No issues with PPI. No other complaints.  REVIEW OF SYSTEMS:  All non-GI ROS negative on review  Past Medical History:  Diagnosis Date  . Allergy   . Diabetes mellitus without complication (HCC)   . Distal radius fracture, right 08/2015   FOOSH type injury  . Hearing loss    history of working around loud machinery  . History of sinusitis    yearly  . Leg pain, diffuse   . Meningitis    6 months old  . Plantar fasciitis     Past Surgical History:  Procedure Laterality Date  . APPENDECTOMY    . ESOPHAGOGASTRODUODENOSCOPY (EGD) WITH PROPOFOL N/A 07/27/2016   Procedure: ESOPHAGOGASTRODUODENOSCOPY (EGD) WITH PROPOFOL; with Dilation;  Surgeon: Hilarie Fredrickson, MD;  Location: [facility];  Service: Endoscopy;  Laterality: N/A;  . OPEN REDUCTION INTERNAL FIXATION (ORIF) DISTAL RADIAL FRACTURE Right 08/03/2015   Procedure: OPEN REDUCTION INTERNAL FIXATION (ORIF) RIGHT DISTAL RADIUS FRACTURE;  Surgeon: Betha Loa, MD;  Location: [facility];  Service: Orthopedics;  Laterality: Right;  Marland Kitchen VASECTOMY    . WISDOM TOOTH EXTRACTION      Social History Nathaniel Hahn  reports that he has never smoked. He has never used smokeless tobacco. He reports that he drinks alcohol. He reports that he does not use drugs.  family history includes Asthma in his sister; COPD in his mother; Colon polyps in his  mother; Hiatal hernia in his mother.  Allergies  Allergen Reactions  . Onion Nausea And Vomiting  . Penicillins Rash    Has patient had a PCN reaction causing immediate rash, facial/tongue/throat swelling, SOB or lightheadedness with hypotension: {unknown Has patient had a PCN reaction causing severe rash involving mucus membranes or skin necrosis: {no Has patient had a PCN reaction that required hospitalization {no Has patient had a PCN reaction occurring within the last 10 years: {no If all of the above answers are "NO", then may proceed with Cephalosporin use.       PHYSICAL EXAMINATION: Vital signs: BP 106/74 (Cuff Size: Large)   Pulse 72   Ht 6\' 3"  (1.905 m)   Wt (!) 375 lb 12.8 oz (170.5 kg)   BMI 46.97 kg/m   Constitutional: Obese, generally well-appearing, no acute distress Psychiatric: alert and oriented x3, cooperative Eyes: extraocular movements intact, anicteric, conjunctiva pink Mouth: oral pharynx moist, no lesions Neck: supple no lymphadenopathy Cardiovascular: heart regular rate and rhythm, no murmur Lungs: clear to auscultation bilaterally Abdomen: soft, nontender, nondistended, no obvious ascites, no peritoneal signs, normal bowel sounds, no organomegaly Rectal: Omitted Extremities: no clubbing cyanosis or lower extremity edema bilaterally Skin: no lesions on visible extremities Neuro: No focal deficits.   ASSESSMENT:  #1. GERD, complicated by peptic stricture. Asymptomatic post dilation on PPI #2. Morbid obesity #3. Screening colonoscopy. Discussed  PLAN:  #1. Reflux precautions with attention to weight loss #2. Continue PPI daily #3. Routine GI follow-up one year. Contact the office in the  interim for any questions, problems, recurrent dysphagia #4. The patient states that he will contact the office to schedule outpatient screening colonoscopy at his convenience. Otherwise he will resume general medical care with his PCP  25 minutes was spent  face-to-face with the patient. Greater than 50% a time use for counseling regarding his GERD, complicated by peptic stricture and colon cancer screening discussion

## 2016-09-14 ENCOUNTER — Other Ambulatory Visit: Payer: BLUE CROSS/BLUE SHIELD

## 2016-09-14 DIAGNOSIS — R7301 Impaired fasting glucose: Secondary | ICD-10-CM

## 2016-09-14 DIAGNOSIS — E785 Hyperlipidemia, unspecified: Secondary | ICD-10-CM

## 2016-09-14 LAB — LIPID PANEL
CHOL/HDL RATIO: 4.8 ratio (ref <5.0)
CHOLESTEROL: 143 mg/dL (ref <200)
HDL: 30 mg/dL — AB (ref >40)
LDL Cholesterol: 90 mg/dL (ref <100)
Triglycerides: 117 mg/dL (ref <150)
VLDL: 23 mg/dL (ref <30)

## 2016-09-14 LAB — COMPREHENSIVE METABOLIC PANEL
ALBUMIN: 3.8 g/dL (ref 3.6–5.1)
ALT: 37 U/L (ref 9–46)
AST: 23 U/L (ref 10–35)
Alkaline Phosphatase: 83 U/L (ref 40–115)
BILIRUBIN TOTAL: 0.6 mg/dL (ref 0.2–1.2)
BUN: 8 mg/dL (ref 7–25)
CALCIUM: 8.9 mg/dL (ref 8.6–10.3)
CO2: 24 mmol/L (ref 20–31)
CREATININE: 0.78 mg/dL (ref 0.70–1.33)
Chloride: 104 mmol/L (ref 98–110)
Glucose, Bld: 118 mg/dL — ABNORMAL HIGH (ref 65–99)
Potassium: 3.6 mmol/L (ref 3.5–5.3)
SODIUM: 140 mmol/L (ref 135–146)
Total Protein: 6.4 g/dL (ref 6.1–8.1)

## 2016-09-15 ENCOUNTER — Ambulatory Visit (INDEPENDENT_AMBULATORY_CARE_PROVIDER_SITE_OTHER): Payer: BLUE CROSS/BLUE SHIELD | Admitting: Medical

## 2016-09-15 ENCOUNTER — Encounter: Payer: Self-pay | Admitting: Medical

## 2016-09-15 VITALS — BP 128/82 | HR 71 | Temp 98.0°F | Wt 372.0 lb

## 2016-09-15 DIAGNOSIS — S9782XA Crushing injury of left foot, initial encounter: Secondary | ICD-10-CM

## 2016-09-15 DIAGNOSIS — M25562 Pain in left knee: Secondary | ICD-10-CM

## 2016-09-15 DIAGNOSIS — S8992XA Unspecified injury of left lower leg, initial encounter: Secondary | ICD-10-CM

## 2016-09-15 DIAGNOSIS — W19XXXA Unspecified fall, initial encounter: Secondary | ICD-10-CM

## 2016-09-15 DIAGNOSIS — M7989 Other specified soft tissue disorders: Secondary | ICD-10-CM | POA: Diagnosis not present

## 2016-09-15 LAB — HEMOGLOBIN A1C
Hgb A1c MFr Bld: 5.3 % (ref <5.7)
MEAN PLASMA GLUCOSE: 105 mg/dL

## 2016-09-15 MED ORDER — SULFAMETHOXAZOLE-TRIMETHOPRIM 800-160 MG PO TABS: 1.0000 | ORAL_TABLET | Freq: Two times a day (BID) | ORAL | 0 refills | Status: DC

## 2016-09-15 NOTE — Progress Notes (Signed)
Subjective: Chief Complaint  Patient presents with  . left leg pain    left leg pain x 1 week  l, swelling in left foot    Here for left leg pain x 1 weeks.  DOI: 09/09/16  Having some swelling in left foot.  Last week during dark part of the evening was waling towards the house.   Tripped over a root.  Larey Seat forward, and brunt of his weight landed on left knee.  Had knee pain at that time.  Had some pain in left foot and ankle at the time like he stubbed his toe.   The whole foot hurt.  The next day continued to have some pain, had some knee and foot swelling the next day.   Over the course of the week you have left foot swelling, discolored.  Worried about cellulitis.  Denies fever.   Using some ibuprofen the first few days.   Denies head injury, LOC, no right lower extremity pain, no other injury or pain noted.  Has done some elevation but no ice. Not checking glucose.    No other aggravating or relieving factors. No other complaint.  Past Medical History:  Diagnosis Date  . Allergy   . Diabetes mellitus without complication (HCC)   . Distal radius fracture, right 08/2015   FOOSH type injury  . Hearing loss    history of working around loud machinery  . History of sinusitis    yearly  . Leg pain, diffuse   . Meningitis    6 months old  . Plantar fasciitis    Current Outpatient Prescriptions on File Prior to Visit  Medication Sig Dispense Refill  . ibuprofen (ADVIL,MOTRIN) 200 MG tablet Take 200 mg by mouth every 6 (six) hours as needed for moderate pain.     . metFORMIN (GLUCOPHAGE) 500 MG tablet Take 1 tablet (500 mg total) by mouth 2 (two) times daily with a meal. 1 tablet po BID for diabetes 60 tablet 2  . Multiple Vitamin (MULTIVITAMIN) tablet Take 1 tablet by mouth daily.    Marland Kitchen omeprazole (PRILOSEC) 20 MG capsule Take 20 mg by mouth daily.    . pravastatin (PRAVACHOL) 20 MG tablet Take 1 tablet (20 mg total) by mouth daily. 30 tablet 2   No current facility-administered  medications on file prior to visit.    Review of Systems Constitutional: -fever, -chills, -sweats, -unexpected weight change,-fatigue ENT: -runny nose, -ear pain, -sore throat Cardiology:  -chest pain, -palpitations, -edema Respiratory: -cough, -shortness of breath, -wheezing Gastroenterology: -abdominal pain, -nausea, -vomiting, -diarrhea, -constipation Hematology: -bleeding or bruising problems Urology: -dysuria, -difficulty urinating, -hematuria, -urinary frequency, -urgency Neurology: -headache, -weakness, -tingling, -numbness     Objective: BP 128/82   Pulse 71   Temp 98 F (36.7 C)   Wt (!) 372 lb (168.7 kg)   SpO2 95%   BMI 46.50 kg/m   Wt Readings from Last 3 Encounters:  09/15/16 (!) 372 lb (168.7 kg)  09/11/16 (!) 375 lb 12.8 oz (170.5 kg)  08/16/16 (!) 372 lb (168.7 kg)   Gen: wd, wn obese white male Mild tenderness of left knee anteriorly, mild swelling of left knee in general, mild pain noted with knee flexion and extension, mainly anterior knee, no pop or grinding, no obvious laxity.  Left leg with mild general swelling from knee to toes.  Warm to touch of knee and foot.   Left 2nd - 5th toes all puffy swollen, all with yellowish coloration with some purplish patches  from bruising General swelling of left foot ankle and toes Ankle normal ROM without pain or laxity, toes with normal ROM without pain No pitting LE edema, no calve tenderness Rest of LE without pain and normal ROM No other UE abnormality, bruising,  CN2-12 intact Legs neurovascularly intact    Assessment: Encounter Diagnoses  Name Primary?  . Acute pain of left knee Yes  . Crushing injury of left foot, initial encounter   . Left knee injury, initial encounter   . Fall, initial encounter   . Leg swelling     Plan: No obvious cellulitis or septic joint, no sign of DVT.   Mainly just bruising, swelling and inflammation.  Advised mostly rest this weekend, elevated leg often this weekend, can  use ice pack with cloth in between skin and ice to left foot for 20 min BID.  He has OTC ibuprofen, advised 4 tablets TID all weekend.    Call report Monday in 3 days.   If worse over weekend, such as fever, worse pain or swelling, then go to the ED.  If slightly worsening redness and warmth, then begin Bactrim and call/recheck.  Leonette MostCharles was seen today for left leg pain.  Diagnoses and all orders for this visit:  Acute pain of left knee  Crushing injury of left foot, initial encounter  Left knee injury, initial encounter  Fall, initial encounter  Leg swelling  Other orders -     sulfamethoxazole-trimethoprim (BACTRIM DS,SEPTRA DS) 800-160 MG tablet; Take 1 tablet by mouth 2 (two) times daily.

## 2016-10-13 ENCOUNTER — Other Ambulatory Visit: Payer: Self-pay | Admitting: Medical

## 2016-12-18 ENCOUNTER — Other Ambulatory Visit: Payer: Self-pay | Admitting: Medical

## 2017-01-12 NOTE — Anesthesia Postprocedure Evaluation (Signed)
Anesthesia Post Note  Patient: Nathaniel Hahn  Procedure(s) Performed: Procedure(s) (LRB): ESOPHAGOGASTRODUODENOSCOPY (EGD) WITH PROPOFOL; with Dilation (N/A)     Anesthesia Post Evaluation  Last Vitals:  Vitals:   07/27/16 1002 07/27/16 1030  BP: (!) 94/34 (!) 115/52  Pulse: 81   Resp: 14   Temp: 36.9 C     Last Pain:  Vitals:   07/27/16 1002  TempSrc: Oral                 Nathaniel Hahn EDWARD

## 2017-01-12 NOTE — Addendum Note (Signed)
Addendum  created 01/12/17 1412 by Lajuan Kovaleski, MD   Sign clinical note    

## 2017-03-27 ENCOUNTER — Other Ambulatory Visit: Payer: Self-pay

## 2017-03-27 ENCOUNTER — Telehealth: Payer: Self-pay | Admitting: Family Medicine

## 2017-03-27 MED ORDER — METFORMIN HCL 500 MG PO TABS: 500.0000 mg | ORAL_TABLET | Freq: Two times a day (BID) | ORAL | 0 refills | Status: DC

## 2017-03-27 NOTE — Telephone Encounter (Signed)
[facility]  req refill on Metformin HCL 500 mg tab  #180

## 2017-03-27 NOTE — Telephone Encounter (Signed)
Called and l/m for pt to let him know that we are sending in a  30 day supply but he needs to call us back to set up an med check appt. Sent in 30 day supply.

## 2017-05-22 ENCOUNTER — Other Ambulatory Visit: Payer: Self-pay

## 2017-05-22 ENCOUNTER — Telehealth: Payer: Self-pay | Admitting: Medical

## 2017-05-22 MED ORDER — METFORMIN HCL 500 MG PO TABS: 500.0000 mg | ORAL_TABLET | Freq: Two times a day (BID) | ORAL | 0 refills | Status: DC

## 2017-05-22 NOTE — Telephone Encounter (Signed)
Pt called and made a appt for next week. Needs metformin sent to [facility] [address]. He will be out before then.

## 2017-05-22 NOTE — Telephone Encounter (Signed)
Sent in refill

## 2017-05-31 ENCOUNTER — Ambulatory Visit: Payer: BLUE CROSS/BLUE SHIELD | Admitting: Medical

## 2017-05-31 VITALS — BP 130/80 | HR 67 | Wt 358.8 lb

## 2017-05-31 DIAGNOSIS — E119 Type 2 diabetes mellitus without complications: Secondary | ICD-10-CM | POA: Diagnosis not present

## 2017-05-31 DIAGNOSIS — R42 Dizziness and giddiness: Secondary | ICD-10-CM | POA: Diagnosis not present

## 2017-05-31 DIAGNOSIS — R7301 Impaired fasting glucose: Secondary | ICD-10-CM

## 2017-05-31 DIAGNOSIS — M722 Plantar fascial fibromatosis: Secondary | ICD-10-CM

## 2017-05-31 DIAGNOSIS — E785 Hyperlipidemia, unspecified: Secondary | ICD-10-CM | POA: Diagnosis not present

## 2017-05-31 DIAGNOSIS — Z1211 Encounter for screening for malignant neoplasm of colon: Secondary | ICD-10-CM | POA: Diagnosis not present

## 2017-05-31 LAB — POCT GLYCOSYLATED HEMOGLOBIN (HGB A1C): HEMOGLOBIN A1C: 5.6

## 2017-05-31 MED ORDER — MECLIZINE HCL 25 MG PO TABS
25.0000 mg | ORAL_TABLET | Freq: Two times a day (BID) | ORAL | 0 refills | Status: DC
Start: 2017-05-31 — End: 2020-03-16

## 2017-05-31 MED ORDER — PRAVASTATIN SODIUM 20 MG PO TABS: 20.0000 mg | ORAL_TABLET | Freq: Every day | ORAL | 1 refills | Status: DC

## 2017-05-31 MED ORDER — METFORMIN HCL 500 MG PO TABS: ORAL_TABLET | ORAL | 1 refills | Status: DC

## 2017-05-31 NOTE — Progress Notes (Signed)
Subjective:  Chief Complaint  Patient presents with  . Diabetes    dm check , possible inner dizzness on sunday    Here today for routine diabetes follow-up.   Diabetes-Lately been taking Metformin 500mg  once daily last 8 weeks.  He was having loose stools with metformin twice daily, so he drop back to once daily.  Home glucose looks fine, he has been able to lose about 25 pounds of late.  Checking feet, no foot lesions.  He is compliant with Pravachol  Hyperlipidemia-he is compliant with Pravachol 20 mg without complaint.  He had a few episodes of dizziness this past weekend, Lasted for seconds at a time but lasted throughout the day Saturday and then some the next day.  It finally resolved and has not returned.  Denies chest pain, edema, shortness of breath, syncope.  He continues to battle with plantar fasciitis  Declines flu shot   Past Medical History:  Diagnosis Date  . Allergy   . Diabetes mellitus without complication (HCC)   . Distal radius fracture, right 08/2015   FOOSH type injury  . Hearing loss    history of working around loud machinery  . History of sinusitis    yearly  . Leg pain, diffuse   . Meningitis    6 months old  . Plantar fasciitis    Current Outpatient Medications on File Prior to Visit  Medication Sig Dispense Refill  . ibuprofen (ADVIL,MOTRIN) 200 MG tablet Take 200 mg by mouth every 6 (six) hours as needed for moderate pain.     Marland Kitchen. omeprazole (PRILOSEC) 20 MG capsule Take 20 mg by mouth daily.    . Multiple Vitamin (MULTIVITAMIN) tablet Take 1 tablet by mouth daily.     No current facility-administered medications on file prior to visit.    Review of systems as in subjective    Objective: BP 130/80   Pulse 67   Wt (!) 358 lb 12.8 oz (162.8 kg)   SpO2 98%   BMI 44.85 kg/m   BP Readings from Last 3 Encounters:  05/31/17 130/80  09/15/16 128/82  09/11/16 106/74   Wt Readings from Last 3 Encounters:  05/31/17 (!) 358 lb 12.8 oz  (162.8 kg)  09/15/16 (!) 372 lb (168.7 kg)  09/11/16 (!) 375 lb 12.8 oz (170.5 kg)    General appearance: alert, no distress, WD/WN,  HEENT: normocephalic, sclerae anicteric, right TM pearly, left ear canal normal, left TM flat with slight pink discoloration,  nares patent, no discharge or erythema, pharynx normal Oral cavity: MMM, no lesions Neck: supple, no lymphadenopathy, no thyromegaly, no masses Heart: RRR, normal S1, S2, no murmurs Lungs: CTA bilaterally, no wheezes, rhonchi, or rales Pulses: 2+ symmetric, upper and lower extremities, normal cap refill Extremities without edema  Diabetic Foot Exam - Simple   Simple Foot Form Diabetic Foot exam was performed with the following findings:  Yes 05/31/2017  1:56 PM  Visual Inspection No deformities, no ulcerations, no other skin breakdown bilaterally:  Yes Sensation Testing Intact to touch and monofilament testing bilaterally:  Yes Pulse Check Posterior Tibialis and Dorsalis pulse intact bilaterally:  Yes Comments       Assessment: Encounter Diagnoses  Name Primary?  . Impaired fasting blood sugar Yes  . Hyperlipidemia, unspecified hyperlipidemia type   . Screening for colon cancer   . Plantar fasciitis   . Vertigo   . Diabetes mellitus without complication (HCC)       Plan: Prediabetes-glad to see he has  lost some weight, continue healthy diet and exercise, continue efforts to lose weight.  Continue Metformin 500 mg once daily.   discussed possibly adding [place]Farxiga or Jardiance but he declines for now.  Continue foot checks.  Hemoglobin A1c at goal.  Hyperlipidemia-continue same medication, reviewed last labs in chart.    Discussed stretching, nighttime foot splints, plantar fasciitis, not going barefoot in the house, and other supportive measures.  He is up-to-date on TD vaccine, 2011.  Recommended he check on insurance coverage for shingrix vaccine.  Discussed colonoscopy screening.  He will consider and let me  know.  Vertigo-discussed diagnosis, treatment, can use meclizine as needed  Leonette MostCharles was seen today for diabetes.  Diagnoses and all orders for this visit:  Impaired fasting blood sugar  Hyperlipidemia, unspecified hyperlipidemia type  Screening for colon cancer  Plantar fasciitis  Vertigo  Diabetes mellitus without complication (HCC) Comments: Error Orders: -     HgB A1c  Other orders -     metFORMIN (GLUCOPHAGE) 500 MG tablet; TAKE 1 TABLET TWICE A DAY WITH A MEAL OR FOOD FOR DIABETES -     pravastatin (PRAVACHOL) 20 MG tablet; Take 1 tablet (20 mg total) by mouth daily. -     meclizine (ANTIVERT) 25 MG tablet; Take 1 tablet (25 mg total) by mouth 2 (two) times daily.

## 2017-05-31 NOTE — Patient Instructions (Signed)
Recommendations:  I recommend you have a shingles vaccine to help prevent shingles or herpes zoster outbreak.   Please call your insurer to inquire about coverage for the Shingrix vaccine given in 2 doses.   Some insurers cover this vaccine after age 52, some cover this after age 52.  If your insurer covers this, then call to schedule appointment to have this vaccine here.   Call insurance above coverage for Cologuard test

## 2018-01-07 ENCOUNTER — Other Ambulatory Visit: Payer: Self-pay | Admitting: Medical

## 2018-01-07 NOTE — Telephone Encounter (Signed)
Left message for patient to call back to schedule fasting med check.

## 2018-01-28 ENCOUNTER — Encounter: Payer: BLUE CROSS/BLUE SHIELD | Admitting: Medical

## 2018-01-29 ENCOUNTER — Encounter: Payer: BLUE CROSS/BLUE SHIELD | Admitting: Medical

## 2018-01-29 ENCOUNTER — Ambulatory Visit (INDEPENDENT_AMBULATORY_CARE_PROVIDER_SITE_OTHER): Payer: Self-pay | Admitting: Medical

## 2018-01-29 VITALS — BP 136/88 | HR 74 | Temp 97.2°F | Resp 16 | Ht 75.0 in | Wt 364.2 lb

## 2018-01-29 DIAGNOSIS — R1314 Dysphagia, pharyngoesophageal phase: Secondary | ICD-10-CM

## 2018-01-29 DIAGNOSIS — R7301 Impaired fasting glucose: Secondary | ICD-10-CM

## 2018-01-29 DIAGNOSIS — K2100 Gastro-esophageal reflux disease with esophagitis, without bleeding: Secondary | ICD-10-CM

## 2018-01-29 DIAGNOSIS — Z1211 Encounter for screening for malignant neoplasm of colon: Secondary | ICD-10-CM

## 2018-01-29 DIAGNOSIS — E785 Hyperlipidemia, unspecified: Secondary | ICD-10-CM

## 2018-01-29 LAB — COMPREHENSIVE METABOLIC PANEL
A/G RATIO: 1.6 (ref 1.2–2.2)
ALBUMIN: 4.2 g/dL (ref 3.5–5.5)
ALT: 32 IU/L (ref 0–44)
AST: 18 IU/L (ref 0–40)
Alkaline Phosphatase: 102 IU/L (ref 39–117)
BUN / CREAT RATIO: 14 (ref 9–20)
BUN: 10 mg/dL (ref 6–24)
Bilirubin Total: 0.6 mg/dL (ref 0.0–1.2)
CALCIUM: 8.9 mg/dL (ref 8.7–10.2)
CO2: 23 mmol/L (ref 20–29)
CREATININE: 0.74 mg/dL — AB (ref 0.76–1.27)
Chloride: 102 mmol/L (ref 96–106)
GFR, EST AFRICAN AMERICAN: 123 mL/min/{1.73_m2} (ref >59)
GFR, EST NON AFRICAN AMERICAN: 106 mL/min/{1.73_m2} (ref >59)
GLOBULIN, TOTAL: 2.6 g/dL (ref 1.5–4.5)
Glucose: 116 mg/dL — ABNORMAL HIGH (ref 65–99)
POTASSIUM: 3.7 mmol/L (ref 3.5–5.2)
SODIUM: 140 mmol/L (ref 134–144)
TOTAL PROTEIN: 6.8 g/dL (ref 6.0–8.5)

## 2018-01-29 LAB — HEMOGLOBIN A1C
Est. average glucose Bld gHb Est-mCnc: 114 mg/dL
HEMOGLOBIN A1C: 5.6 % (ref 4.8–5.6)

## 2018-01-29 NOTE — Progress Notes (Signed)
Subjective: Chief Complaint  Patient presents with  . med check    fasting med check    Here for fasting med check.    Feeling ok in general.   Moving a lot in general at work, but no specific exercise.   Eating habits not been good the last month or so.   Stress been worse or late, financial and family and job stressors.  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    Taking Pravachol and Metformin as usual.    Not checking sugars,hasn't had to do this yet.   Has not had colonoscopy yet.   He has talked to Dr. Marina Goodell about this.   GERD - uses generic Prilosec at [facility].  Dr. Marina Goodell said he would need to be on this the rest of his life.  No other aggravating or relieving factors. No other complaint.   Past Medical History:  Diagnosis Date  . Allergy   . Diabetes mellitus without complication (HCC)   . Distal radius fracture, right 08/2015   FOOSH type injury  . Hearing loss    history of working around loud machinery  . History of sinusitis    yearly  . Leg pain, diffuse   . Meningitis    6 months old  . Plantar fasciitis     Current Outpatient Medications on File Prior to Visit  Medication Sig Dispense Refill  . ibuprofen (ADVIL,MOTRIN) 200 MG tablet Take 200 mg by mouth every 6 (six) hours as needed for moderate pain.     . metFORMIN (GLUCOPHAGE) 500 MG tablet TAKE 1 TABLET TWICE A DAY WITH A MEAL OR FOOD FOR DIABETES (Patient taking differently: Take 500 mg by mouth daily with breakfast. TAKE 1 TABLET TWICE A DAY WITH A MEAL OR FOOD FOR DIABETES) 180 tablet 1  . Multiple Vitamin (MULTIVITAMIN) tablet Take 1 tablet by mouth daily.    Marland Kitchen omeprazole (PRILOSEC) 20 MG capsule Take 20 mg by mouth daily.    . pravastatin (PRAVACHOL) 20 MG tablet TAKE 1 TABLET BY MOUTH EVERY DAY 30 tablet 0  . meclizine (ANTIVERT) 25 MG tablet Take 1 tablet (25 mg total) by mouth 2 (two) times daily. (Patient not taking: Reported on 01/29/2018) 30 tablet 0   No current  facility-administered medications on file prior to visit.    ROS as in subjective   Objective: BP 136/88   Pulse 74   Temp (!) 97.2 F (36.2 C) (Oral)   Resp 16   Ht 6\' 3"  (1.905 m)   Wt (!) 364 lb 3.2 oz (165.2 kg)   SpO2 95%   BMI 45.52 kg/m   Wt Readings from Last 3 Encounters:  01/29/18 (!) 364 lb 3.2 oz (165.2 kg)  05/31/17 (!) 358 lb 12.8 oz (162.8 kg)  09/15/16 (!) 372 lb (168.7 kg)   General appearance: alert, no distress, WD/WN,  Neck: supple, no lymphadenopathy, no thyromegaly, no masses, no bruits Heart: RRR, normal S1, S2, no murmurs Lungs: CTA bilaterally, no wheezes, rhonchi, or rales Abdomen: +bs, soft, non tender, non distended, no masses, no hepatomegaly, no splenomegaly Pulses: 2+ symmetric, upper and lower extremities, normal cap refill Ext: no edema  Diabetic Foot Exam - Simple   Simple Foot Form Diabetic Foot exam was performed with the following findings:  Yes 01/29/2018 10:42 AM  Visual Inspection See comments:  Yes Sensation Testing Intact to touch and monofilament testing bilaterally:  Yes Pulse Check Posterior Tibialis and Dorsalis pulse  intact bilaterally:  Yes Comments Thickened great toenail, right.        Assessment: Encounter Diagnoses  Name Primary?  . Impaired fasting blood sugar Yes  . Hyperlipidemia, unspecified hyperlipidemia type   . Gastroesophageal reflux disease with esophagitis   . Pharyngoesophageal dysphagia   . Screen for colon cancer      Plan: We discussed his medications, activity and diet, counseled on ways to improve overall healthy lifestyle and diet.  Continue current medications.  Labs today as below.  He wanted to limit labs today given cost as he is self-pay today.  GERD- managed with over-the-counter PPI daily  Sees gastroenterology for dysphagia.  I advised he check with Gastroenterology about doing his colonoscopy and help on pricing.  He has never had a colonoscopy   Leonette MostCharles was seen today for med  check.  Diagnoses and all orders for this visit:  Impaired fasting blood sugar -     HM DIABETES FOOT EXAM -     HM DIABETES EYE EXAM -     Hemoglobin A1c -     Comprehensive metabolic panel  Hyperlipidemia, unspecified hyperlipidemia type -     Comprehensive metabolic panel  Gastroesophageal reflux disease with esophagitis  Pharyngoesophageal dysphagia  Screen for colon cancer

## 2018-02-04 ENCOUNTER — Telehealth: Payer: Self-pay

## 2018-02-04 ENCOUNTER — Other Ambulatory Visit: Payer: Self-pay | Admitting: Medical

## 2018-02-04 NOTE — Telephone Encounter (Signed)
Left message on voicemail for patient to call back to schedule appointment.  

## 2018-02-06 ENCOUNTER — Other Ambulatory Visit: Payer: Self-pay | Admitting: Medical

## 2018-02-06 MED ORDER — PRAVASTATIN SODIUM 20 MG PO TABS: 20.0000 mg | ORAL_TABLET | Freq: Every day | ORAL | 3 refills | Status: DC

## 2018-02-06 MED ORDER — METFORMIN HCL 500 MG PO TABS: 500.0000 mg | ORAL_TABLET | Freq: Every day | ORAL | 3 refills | Status: DC

## 2018-04-08 IMAGING — RF DG ESOPHAGUS
11 of 13 series · 13 of 24 positions shown · non-contrast
Comparison: 11/17/2013

CLINICAL DATA: Sensation of food getting caught in throat.

EXAM:
ESOPHOGRAM / BARIUM SWALLOW / BARIUM TABLET STUDY
TECHNIQUE: Combined double contrast and single contrast examination performed
using effervescent crystals, thick barium liquid, and thin barium
liquid. The patient was observed with fluoroscopy swallowing a 13 mm
barium sulphate tablet.
FLUOROSCOPY TIME:  Fluoroscopy Time:  2 minutes and 6 seconds.
Radiation Exposure Index (if provided by the fluoroscopic device):
10042.5 mGy
Number of Acquired Spot Images: 0

[Series 1: fluoro_barium 2fps_bw · 0.17mm/px · 2 of 8 frames shown (1 of 4)]
[frame 2/8]
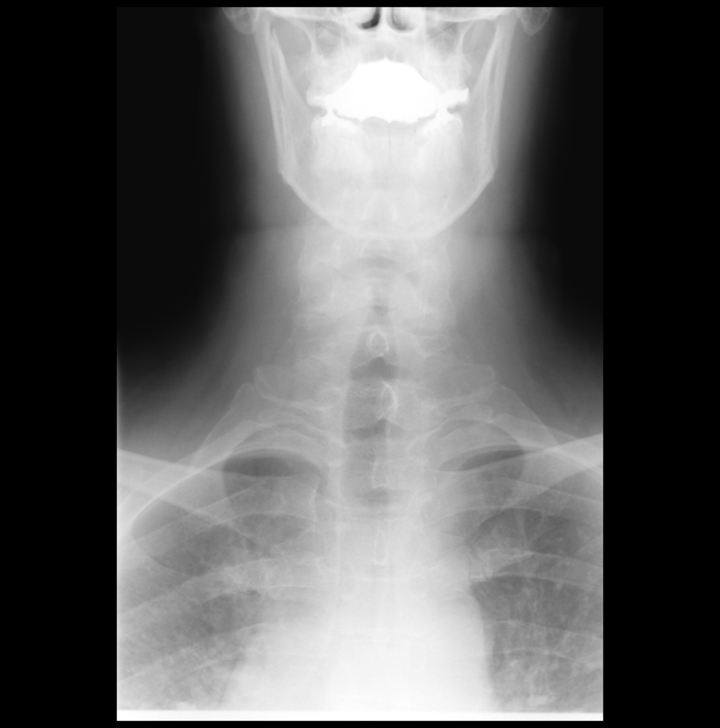
[frame 7/8]
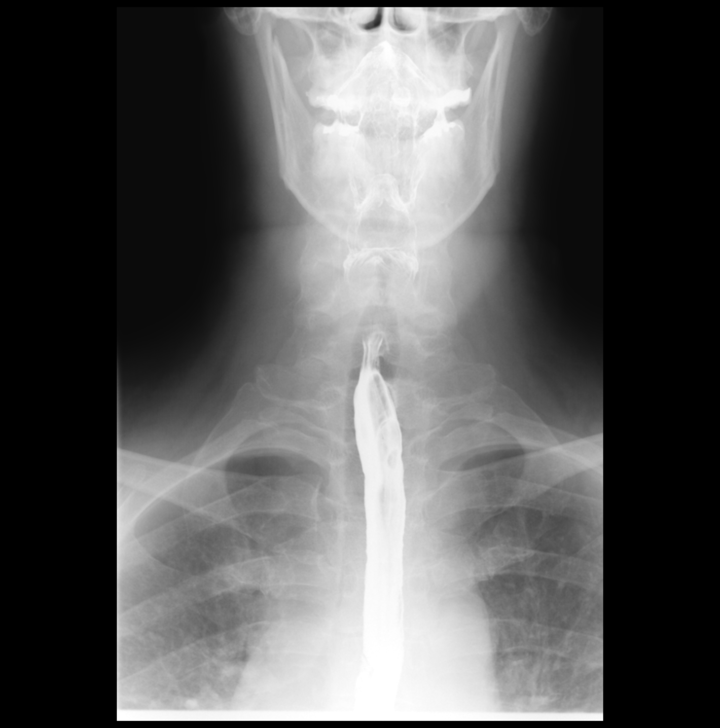

[Series 2: fluoro_barium 2fps_bw · 0.17mm/px · 1 of 6 frames shown (2 of 4)]
[frame 6/6]
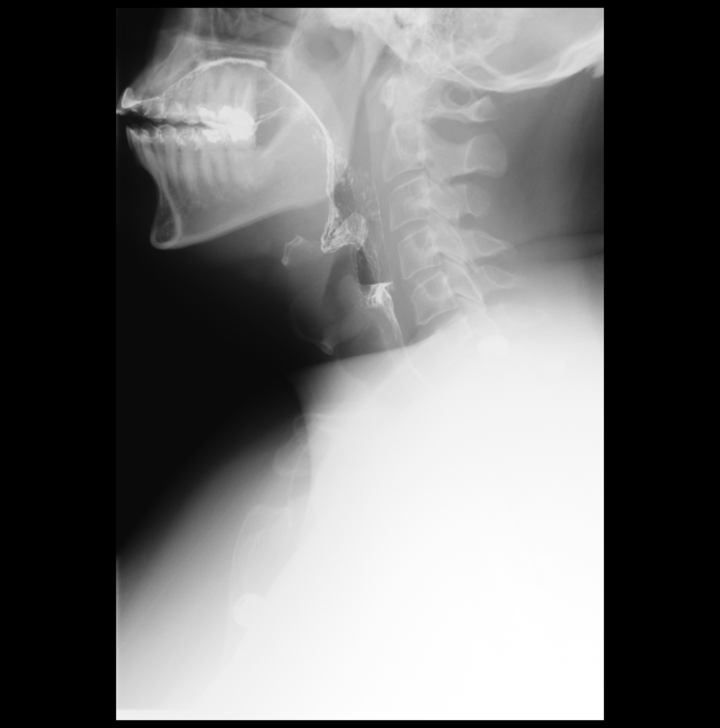

[Series 3: fluoro_barium 2fps_bw · 0.17mm/px · 1 of 35 frames shown (3 of 4)]
[frame 18/35]
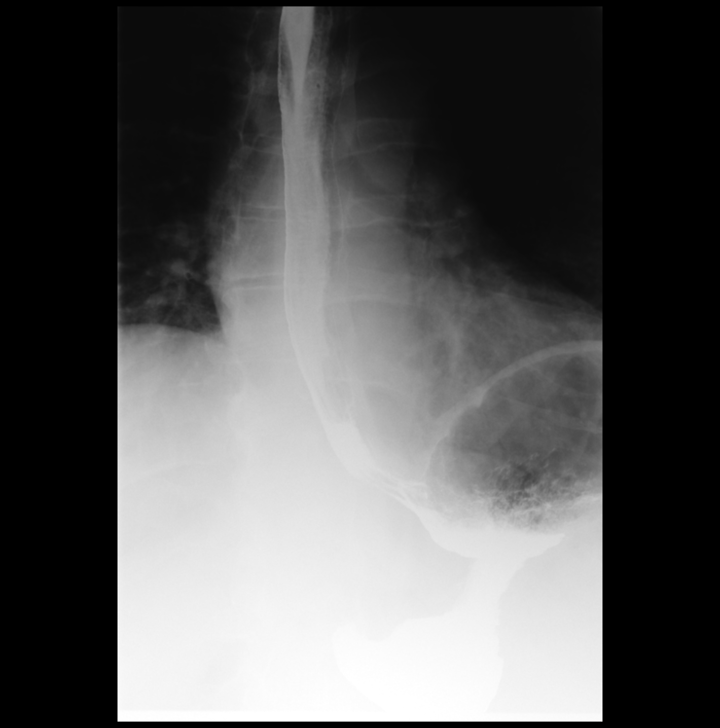

[Series 4: fluoro_barium 2fps_bw · 0.17mm/px · 1 of 3 frames shown (4 of 4)]
[frame 3/3]
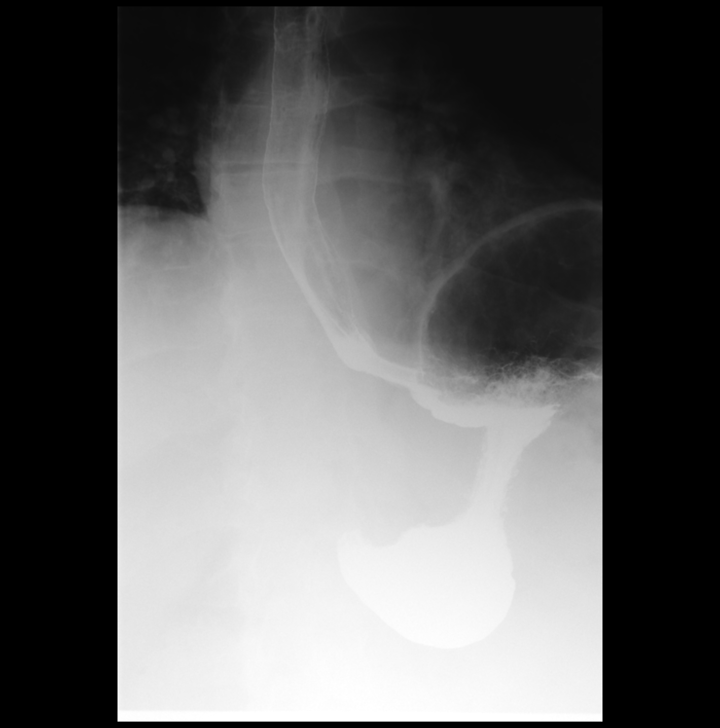

[Series 6: cp_standard · 0.53mm/px · 1 of 24 frames shown (1 of 7)]
[frame 13/24]
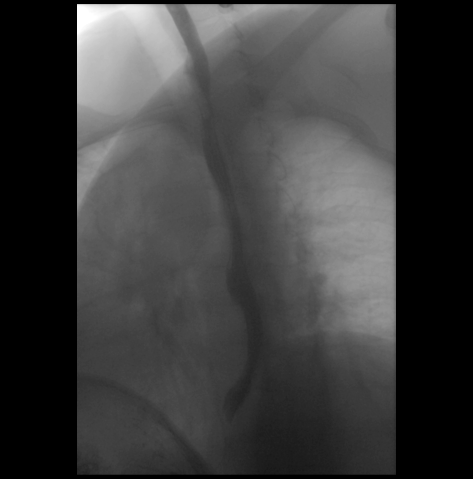

[Series 7: cp_standard · 0.53mm/px · 2 of 29 frames shown (2 of 7)]
[frame 5/29]
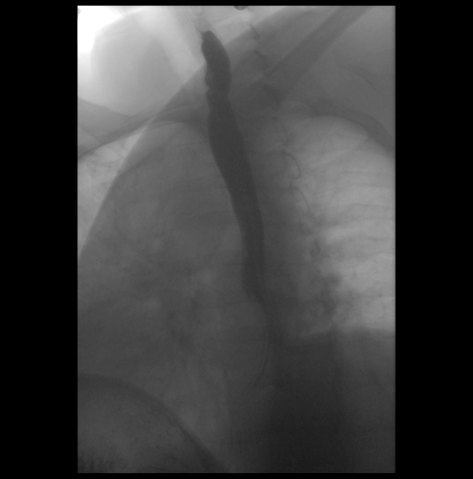
[frame 25/29]
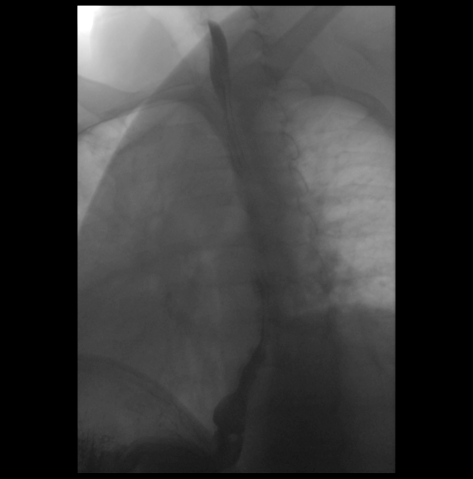

[Series 8: cp_standard · 0.53mm/px · 1 of 16 frames shown (3 of 7)]
[frame 3/16]
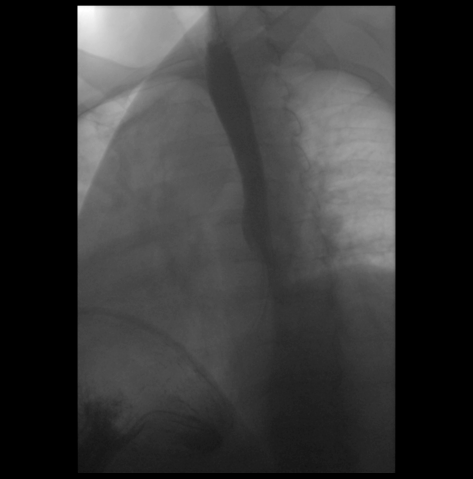

[Series 9: cp_standard · 0.54mm/px · 1 of 34 frames shown (4 of 7)]
[frame 18/34]
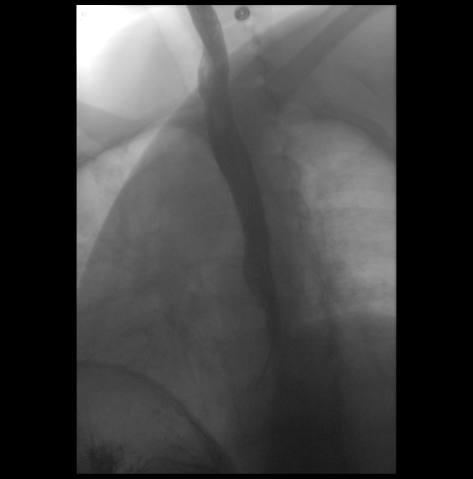

[Series 10: cp_standard · 0.27mm/px · 1 of 1 slices shown (5 of 7)]
[im 1/1]
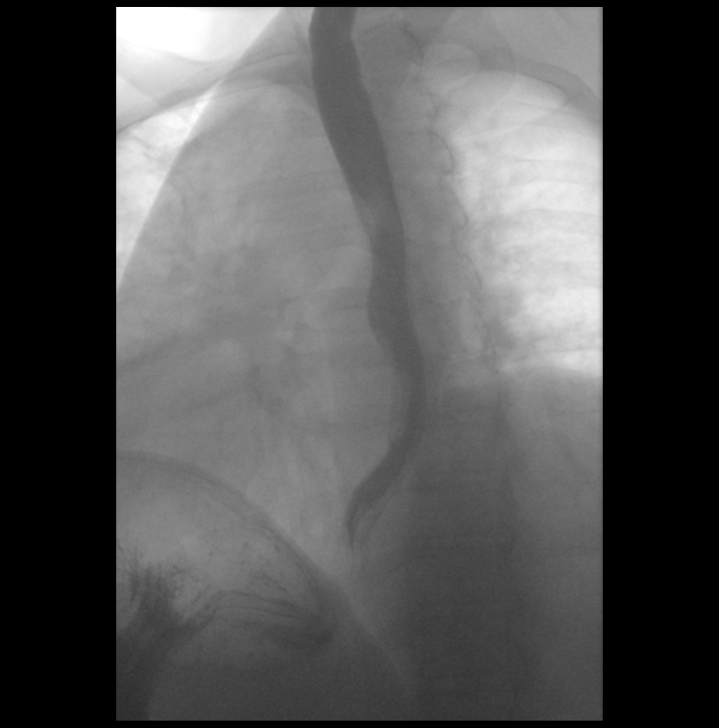

[Series 13: cp_standard · 0.51mm/px · 1 of 31 frames shown (6 of 7)]
[frame 16/31]
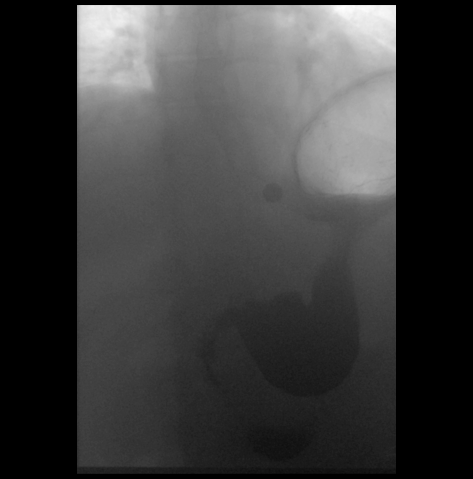

[Series 15: cp_standard · 0.25mm/px · 1 of 1 slices shown (7 of 7)]
[im 1/1]
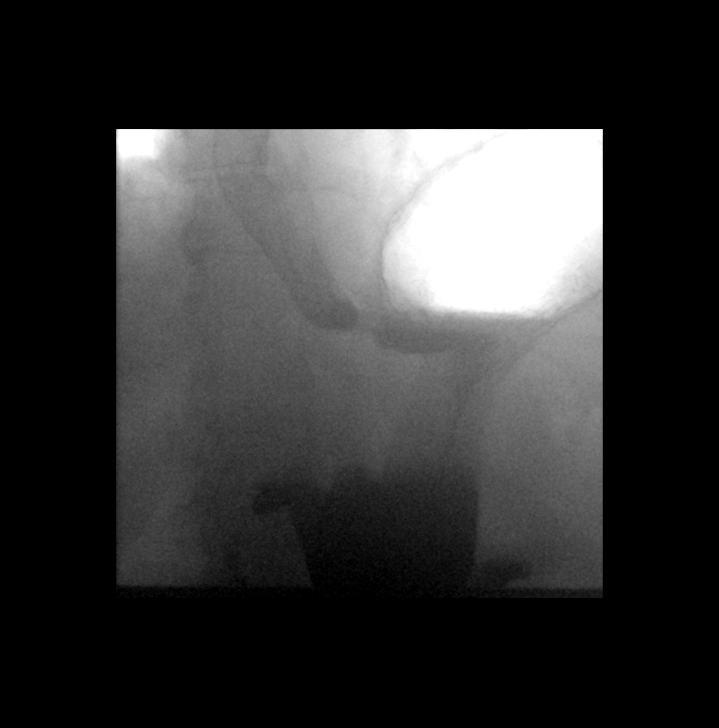

[13 of 24 positions shown; findings below may reference images not displayed]

FINDINGS: Frontal and lateral views of the hypopharynx while swallowing are
unremarkable. Minimal rightward deviation of the upper esophagus is
again noted, unchanged in the interval.

Double contrast imaging shows no evidence for esophageal mass
lesion, stricture, or diverticulum. Transverse esophageal folds are
noted in the distal [DATE] of the esophagus on some swallows,
consistent with feline esophagus.

Evaluation of esophageal motility shows interruption of primary
peristalsis on nearly all swallows. Tertiary contractions noted in
the distal esophagus without evidence for presbyesophagus.

Although not clearly seen on all swallows, the patient does appear
to have a tiny hiatal hernia with a Schatzki ring. Again, this was
only evident on some swallows.

13 mm barium tablet lodges in the distal esophagus in the region of
a Schatzki ring. Despite repeated swallows of water and thin barium,
the tablet does not pass into the stomach.
IMPRESSION: 1. Nonspecific esophageal motility disorder.
2. Findings of feline esophagus raise concern for gastroesophageal
reflux disease.
3. Tiny hiatal hernia with Schatzki ring, obstructive to a 13 mm
barium tablet.

## 2019-01-30 ENCOUNTER — Other Ambulatory Visit: Payer: Self-pay | Admitting: Medical

## 2019-01-30 NOTE — Telephone Encounter (Signed)
Is this ok to refill?  

## 2019-01-31 NOTE — Telephone Encounter (Signed)
Get in for CPX fasting ,then can given refill

## 2019-02-03 ENCOUNTER — Other Ambulatory Visit: Payer: Self-pay | Admitting: Family Medicine

## 2019-02-03 NOTE — Telephone Encounter (Signed)
PT made an appointment for fasting cpe.  Refill of Prevastatin to [facility] per pt request.

## 2019-02-03 NOTE — Telephone Encounter (Signed)
Left message on voicemail for patient to call back to schedule CPE  

## 2019-02-26 ENCOUNTER — Encounter: Payer: Self-pay | Admitting: Medical

## 2019-03-06 ENCOUNTER — Ambulatory Visit (INDEPENDENT_AMBULATORY_CARE_PROVIDER_SITE_OTHER): Payer: Self-pay | Admitting: Medical

## 2019-03-06 ENCOUNTER — Encounter: Payer: Self-pay | Admitting: Medical

## 2019-03-06 ENCOUNTER — Other Ambulatory Visit: Payer: Self-pay

## 2019-03-06 VITALS — BP 110/78 | HR 85 | Temp 98.1°F | Ht 75.0 in | Wt 371.2 lb

## 2019-03-06 DIAGNOSIS — M5431 Sciatica, right side: Secondary | ICD-10-CM

## 2019-03-06 DIAGNOSIS — R7301 Impaired fasting glucose: Secondary | ICD-10-CM

## 2019-03-06 DIAGNOSIS — E669 Obesity, unspecified: Secondary | ICD-10-CM | POA: Insufficient documentation

## 2019-03-06 DIAGNOSIS — E785 Hyperlipidemia, unspecified: Secondary | ICD-10-CM

## 2019-03-06 DIAGNOSIS — K2100 Gastro-esophageal reflux disease with esophagitis, without bleeding: Secondary | ICD-10-CM

## 2019-03-06 NOTE — Progress Notes (Signed)
Subjective:  Nathaniel Hahn is a 53 y.o. male who presents for Chief Complaint  Patient presents with  . Hyperlipidemia     Here for med check.  No insurance.  He is compliant with medication.  Not exercising.   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  In car for hours a day.   Having some occasionally right thigh pain.  No injury, no trauma.  Otherwise feels fine, no other c/o.   No other aggravating or relieving factors.    No other c/o.  Past Medical History:  Diagnosis Date  . Allergy   . Diabetes mellitus without complication ([place])   . Distal radius fracture, right 08/2015   [place] type injury  . Hearing loss    history of working around loud machinery  . History of sinusitis    yearly  . Leg pain, diffuse   . Meningitis    6 months old  . Plantar fasciitis    Current Outpatient Medications on File Prior to Visit  Medication Sig Dispense Refill  . ibuprofen (ADVIL,MOTRIN) 200 MG tablet Take 200 mg by mouth every 6 (six) hours as needed for moderate pain.     . metFORMIN (GLUCOPHAGE) 500 MG tablet Take 1 tablet (500 mg total) by mouth daily with breakfast. TAKE 1 TABLET TWICE A DAY WITH A MEAL OR FOOD FOR DIABETES 180 tablet 3  . omeprazole (PRILOSEC) 20 MG capsule Take 20 mg by mouth daily.    . pravastatin (PRAVACHOL) 20 MG tablet TAKE ONE TABLET BY MOUTH ONE TIME DAILY  90 tablet 0  . meclizine (ANTIVERT) 25 MG tablet Take 1 tablet (25 mg total) by mouth 2 (two) times daily. (Patient not taking: Reported on 01/29/2018) 30 tablet 0  . Multiple Vitamin (MULTIVITAMIN) tablet Take 1 tablet by mouth daily.     No current facility-administered medications on file prior to visit.      The following portions of the patient's history were reviewed and updated as appropriate: allergies, current medications, past family history, past medical history, past social history, past surgical history and problem list.  ROS Otherwise as in subjective  above  Objective: BP 110/78   Pulse 85   Temp 98.1 F (36.7 C) (Oral)   Ht 6\' 3"  (1.905 m)   Wt (!) 371 lb 3.2 oz (168.4 kg)   SpO2 94%   BMI 46.40 kg/m   General appearance: alert, no distress, well developed, well nourished Neck: supple, no lymphadenopathy, no thyromegaly, no masses Heart: RRR, normal S1, S2, no murmurs Lungs: CTA bilaterally, no wheezes, rhonchi, or rales Abdomen: +bs, soft, non tender, non distended, no masses, no hepatomegaly, no splenomegaly Pulses: 2+ radial pulses, 2+ pedal pulses, normal cap refill Ext: no edema Legs nontender, normal ROM, no thigh tenderness Neuro: normal leg sensation, strength and DTRs.   Assessment: Encounter Diagnoses  Name Primary?  . Impaired fasting blood sugar Yes  . Hyperlipidemia, unspecified hyperlipidemia type   . Gastroesophageal reflux disease with esophagitis   . Obesity, unspecified classification, unspecified obesity type, unspecified whether serious comorbidity present   . Right sided sciatica      Plan: Labs today, c/t current medications.  Encouraged him to work on exercise, diet changes and weight loss.   Counseled on low carb low fat diet.  Advised he consider indigent care clinic if he will not be able to get insurance in the near future.  He is past due on colonoscopy, PSA screening.  Advised flu shot.  He will get this cheaper at a local pharmacy.  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  Nathaniel Hahn was seen today for hyperlipidemia.  Diagnoses and all orders for this visit:  Impaired fasting blood sugar -     Comprehensive metabolic panel -     Hemoglobin A1c -     Lipid panel  Hyperlipidemia, unspecified hyperlipidemia type -     Lipid panel  Gastroesophageal reflux disease with esophagitis  Obesity, unspecified classification, unspecified obesity type, unspecified whether serious comorbidity present -      Comprehensive metabolic panel -     Hemoglobin A1c -     Lipid panel  Right sided sciatica    Follow up: pending labs

## 2019-03-06 NOTE — Patient Instructions (Signed)
Exercise: I recommend exercising most days of the week using a type of exercise that you would enjoy and stick to such as walking, running, swimming, hiking, biking, aerobics, etc. This needs to be at least 30-40 minutes at a time, at least 5 days/week with moderate intensity.  This would be 60-70% of your maximum heart rate.  For example, your maximal heart rate would be 200- your age, multiplied x 0.6 to equal 60% of your maximal heart rate.    Thus, a person age 40 would have a maximum heart rate of 160.  60% of this would be 96 beats per minute.  Thus for fat burning exercise, a 40-year-old would want to exercise has sustained heart rate of about 96 bpm for fat burning.  However for heart health, they would want to exercise at a higher heart rate of about 75% of maximum heart rate which would be a heart rate of about 120 beats per minute.  So I recommend a combination of doing some exercise at moderate intensity, and some exercise at vigorous intensity for heart health.   Low Carb Diet recommendations:  I recommend you drink water throughout the day.   70 ounces or 2 liters would be a good amount.  If you have been accustomed to drinking juice or soda, try water with lemon or water with lime, or try using no calorie flavor dropper.  I recommend the following as an example meal plan that includes 3 meals per day.   You can skip some meals periodically for intermittent fasting.  Breakfast (choose one): . Omelette with egg, can include a little bit of cheese, your choice of mushrooms, peppers, onions, salsa . Smoothie with handful of spinach or kale, 1 cup of milk or water, 1 cup of berries, 1 packet of artificial sweetener such as stevia . Yogurt with fruit   Mid morning snack: . 1 fruit serving and 1 protein such as 8 almonds or 8 nuts, or vegetable such as carrots and humus or other similar vegetable   Lunch: . Salad with 3-4 ounces of lean grilled or baked meat such as fish, chicken or  turkey   Mid afternoon snack: . 1 fruit serving and 1 protein such as 8 almonds or 8 nuts, or vegetable such as carrots and humus or other similar vegetable   Dinner: . Large serving of vegetables and 3-4 ounces of lean grilled or baked meat such as fish, chicken or turkey . Or vegetarian dish without meat    Avoid  . Chips, cookies, cake, donuts, soda, sweet tea, juices, candy, fast food . For now , avoid, or significantly limit grains  

## 2019-03-07 ENCOUNTER — Other Ambulatory Visit: Payer: Self-pay | Admitting: Medical

## 2019-03-07 DIAGNOSIS — M5431 Sciatica, right side: Secondary | ICD-10-CM | POA: Insufficient documentation

## 2019-03-07 LAB — COMPREHENSIVE METABOLIC PANEL
ALT: 57 IU/L — ABNORMAL HIGH (ref 0–44)
AST: 42 IU/L — ABNORMAL HIGH (ref 0–40)
Albumin/Globulin Ratio: 2 (ref 1.2–2.2)
Albumin: 4.7 g/dL (ref 3.8–4.9)
Alkaline Phosphatase: 95 IU/L (ref 39–117)
BUN/Creatinine Ratio: 15 (ref 9–20)
BUN: 13 mg/dL (ref 6–24)
Bilirubin Total: 0.8 mg/dL (ref 0.0–1.2)
CO2: 22 mmol/L (ref 20–29)
Calcium: 9.3 mg/dL (ref 8.7–10.2)
Chloride: 104 mmol/L (ref 96–106)
Creatinine, Ser: 0.89 mg/dL (ref 0.76–1.27)
GFR calc Af Amer: 112 mL/min/{1.73_m2} (ref >59)
GFR calc non Af Amer: 97 mL/min/{1.73_m2} (ref >59)
Globulin, Total: 2.4 g/dL (ref 1.5–4.5)
Glucose: 110 mg/dL — ABNORMAL HIGH (ref 65–99)
Potassium: 3.9 mmol/L (ref 3.5–5.2)
Sodium: 141 mmol/L (ref 134–144)
Total Protein: 7.1 g/dL (ref 6.0–8.5)

## 2019-03-07 LAB — HEMOGLOBIN A1C
Est. average glucose Bld gHb Est-mCnc: 123 mg/dL
Hgb A1c MFr Bld: 5.9 % — ABNORMAL HIGH (ref 4.8–5.6)

## 2019-03-07 LAB — LIPID PANEL
Chol/HDL Ratio: 4.7 ratio (ref 0.0–5.0)
Cholesterol, Total: 165 mg/dL (ref 100–199)
HDL: 35 mg/dL — ABNORMAL LOW (ref >39)
LDL Chol Calc (NIH): 104 mg/dL — ABNORMAL HIGH (ref 0–99)
Triglycerides: 143 mg/dL (ref 0–149)
VLDL Cholesterol Cal: 26 mg/dL (ref 5–40)

## 2019-03-07 MED ORDER — OMEPRAZOLE 20 MG PO CPDR: 20.0000 mg | DELAYED_RELEASE_CAPSULE | Freq: Every day | ORAL | 3 refills | Status: DC

## 2019-03-07 MED ORDER — METFORMIN HCL 500 MG PO TABS: 500.0000 mg | ORAL_TABLET | Freq: Every day | ORAL | 3 refills | Status: DC

## 2019-03-10 ENCOUNTER — Other Ambulatory Visit: Payer: Self-pay | Admitting: Medical

## 2019-03-10 DIAGNOSIS — R7989 Other specified abnormal findings of blood chemistry: Secondary | ICD-10-CM

## 2019-03-10 NOTE — Progress Notes (Signed)
Acute 

## 2019-03-19 ENCOUNTER — Other Ambulatory Visit: Payer: Self-pay

## 2019-03-19 DIAGNOSIS — R7989 Other specified abnormal findings of blood chemistry: Secondary | ICD-10-CM

## 2019-03-20 LAB — HEPATITIS PANEL, ACUTE
Hep A IgM: NEGATIVE
Hep B C IgM: NEGATIVE
Hep C Virus Ab: 0.2 s/co ratio (ref 0.0–0.9)
Hepatitis B Surface Ag: NEGATIVE

## 2019-03-26 ENCOUNTER — Telehealth: Payer: Self-pay

## 2019-03-26 NOTE — Telephone Encounter (Signed)
Ultrasound is the normal next step in evaluation.  I strongly recommend weight loss through low carb, low fat diet, regular exercise.  He has to make this a priority.   I want him to lose 20lb in the next 3 months.   If he declines ultrasound for now, plan to recheck liver test in 3 months assuming success with weight loss.    If I didn't give a diet/exercise handout last time, let me know and I'll print this  Consider weight watchers program or using the recommendation I give.  Another option is to consider working with a trainer to come up with structured exercise and eating plan.

## 2019-03-26 NOTE — Telephone Encounter (Signed)
Patient does not want to do the ultrasound now. He did get the diet/exercise handout that you gave and he plans on eating better and exercising to lose weight and wants his liver checked in 3 months.

## 2019-03-26 NOTE — Telephone Encounter (Signed)
Patient called and wants to know is it necessary for him to get an ultrasound done because he will have to pay for this procedure. Please advise.

## 2019-09-25 ENCOUNTER — Ambulatory Visit: Payer: 59 | Attending: Internal Medicine

## 2019-09-25 DIAGNOSIS — Z23 Encounter for immunization: Secondary | ICD-10-CM

## 2019-09-25 NOTE — Progress Notes (Signed)
   Covid-19 Vaccination Clinic  Name:  Nathaniel Hahn    MRN: [id number] DOB: [birth year]  09/25/2019  [name] was observed post Covid-19 immunization for 15 minutes without incident. He was provided with Vaccine Information Sheet and instruction to access the V-Safe system.   [name] was instructed to call 911 with any severe reactions post vaccine: Marland Kitchen Difficulty breathing  . Swelling of face and throat  . A fast heartbeat  . A bad rash all over body  . Dizziness and weakness   Immunizations Administered    Name Date Dose VIS Date Route   Pfizer COVID-19 Vaccine 09/25/2019  1:08 PM 0.3 mL 06/13/2019 Intramuscular   Manufacturer: [employer], [employer]   Lot: [id number]   NDC: [address]-7

## 2019-10-20 ENCOUNTER — Ambulatory Visit: Payer: 59 | Attending: Internal Medicine

## 2019-10-20 DIAGNOSIS — Z23 Encounter for immunization: Secondary | ICD-10-CM

## 2019-10-20 NOTE — Progress Notes (Signed)
   Covid-19 Vaccination Clinic  Name:  Nathaniel Hahn    MRN: [id number] DOB: 06-22-1966  10/20/2019  [name] was observed post Covid-19 immunization for 15 minutes without incident. He was provided with Vaccine Information Sheet and instruction to access the V-Safe system.   [name] was instructed to call 911 with any severe reactions post vaccine: Marland Kitchen Difficulty breathing  . Swelling of face and throat  . A fast heartbeat  . A bad rash all over body  . Dizziness and weakness   Immunizations Administered    Name Date Dose VIS Date Route   Pfizer COVID-19 Vaccine 10/20/2019  1:01 PM 0.3 mL 08/27/2018 Intramuscular   Manufacturer: [employer], [employer]   Lot: [id number]   NDC: [address]-0

## 2020-02-23 ENCOUNTER — Ambulatory Visit: Payer: 59 | Admitting: Family Medicine

## 2020-02-23 ENCOUNTER — Other Ambulatory Visit: Payer: Self-pay

## 2020-02-23 ENCOUNTER — Encounter: Payer: Self-pay | Admitting: Family Medicine

## 2020-02-23 VITALS — BP 124/82 | HR 76 | Temp 97.5°F | Wt 357.4 lb

## 2020-02-23 DIAGNOSIS — M109 Gout, unspecified: Secondary | ICD-10-CM | POA: Diagnosis not present

## 2020-02-23 MED ORDER — COLCHICINE 0.6 MG PO TABS: 0.6000 mg | ORAL_TABLET | Freq: Two times a day (BID) | ORAL | Status: DC

## 2020-02-23 MED ORDER — COLCHICINE 0.6 MG PO TABS: 0.6000 mg | ORAL_TABLET | Freq: Two times a day (BID) | ORAL | 0 refills | Status: DC

## 2020-02-23 NOTE — Addendum Note (Signed)
Addended by: [name] on: 02/23/2020 01:17 PM   Modules accepted: Orders

## 2020-02-23 NOTE — Progress Notes (Signed)
   Subjective:    Patient ID: Nathaniel Hahn, male    DOB: [birth year], 54 y.o.   MRN: [id number]  HPI He states that last Thursday he noted the onset of left great toe pain over the MTP joint.  It was not hot, red or tender to palpation.  He did occasionally take some ibuprofen for it which did get some relief.  They say his pain is worse.  He might of had 1 other episode similar to this several years ago.  No other joints are involved.   Review of Systems     Objective:   Physical Exam Exam of the left great toe does show some slight tenderness over the MTP joint however is not hot or red no other joints are involved.       Assessment & Plan:  Gouty arthritis of left great toe - Plan: colchicine 0.6 MG tablet Information given concerning the diagnosis of gout.  Explained that this clinically is probably what he has.  Recommend 800 mg of ibuprofen 3 times per day.  I will treat acutely as he is planning a trip to the [facility] and would like to have some relief of this.  Discussed the fact that if this recurs we would then evaluate for possible prevention of gout.  He was comfortable with that approach.

## 2020-02-23 NOTE — Patient Instructions (Addendum)
4 ibuprofen 3 times per day and you can also use 2 Tylenol 4 times a day with that.  Listen to your body in terms of pain               Gout  Gout is painful swelling of your joints. Gout is a type of arthritis. It is caused by having too much uric acid in your body. Uric acid is a chemical that is made when your body breaks down substances called purines. If your body has too much uric acid, sharp crystals can form and build up in your joints. This causes pain and swelling. Gout attacks can happen quickly and be very painful (acute gout). Over time, the attacks can affect more joints and happen more often (chronic gout). What are the causes?  Too much uric acid in your blood. This can happen because: ? Your kidneys do not remove enough uric acid from your blood. ? Your body makes too much uric acid. ? You eat too many foods that are high in purines. These foods include organ meats, some seafood, and beer.  Trauma or stress. What increases the risk?  Having a family history of gout.  Being male and middle-aged.  Being male and having gone through menopause.  Being very overweight (obese).  Drinking alcohol, especially beer.  Not having enough water in the body (being dehydrated).  Losing weight too quickly.  Having an organ transplant.  Having lead poisoning.  Taking certain medicines.  Having kidney disease.  Having a skin condition called psoriasis. What are the signs or symptoms? An attack of acute gout usually happens in just one joint. The most common place is the big toe. Attacks often start at night. Other joints that may be affected include joints of the feet, ankle, knee, fingers, wrist, or elbow. Symptoms of an attack may include:  Very bad pain.  Warmth.  Swelling.  Stiffness.  Shiny, red, or purple skin.  Tenderness. The affected joint may be very painful to touch.  Chills and fever. Chronic gout may cause symptoms more often. More  joints may be involved. You may also have white or yellow lumps (tophi) on your hands or feet or in other areas near your joints. How is this treated?  Treatment for this condition has two phases: treating an acute attack and preventing future attacks.  Acute gout treatment may include: ? NSAIDs. ? Steroids. These are taken by mouth or injected into a joint. ? Colchicine. This medicine relieves pain and swelling. It can be given by mouth or through an IV tube.  Preventive treatment may include: ? Taking small doses of NSAIDs or colchicine daily. ? Using a medicine that reduces uric acid levels in your blood. ? Making changes to your diet. You may need to see a food expert (dietitian) about what to eat and drink to prevent gout. Follow these instructions at home: During a gout attack   If told, put ice on the painful area: ? Put ice in a plastic bag. ? Place a towel between your skin and the bag. ? Leave the ice on for 20 minutes, 2-3 times a day.  Raise (elevate) the painful joint above the level of your heart as often as you can.  Rest the joint as much as possible. If the joint is in your leg, you may be given crutches.  Follow instructions from your doctor about what you cannot eat or drink. Avoiding future gout attacks  Eat a low-purine diet.  Avoid foods and drinks such as: ? Liver. ? Kidney. ? Anchovies. ? Asparagus. ? Herring. ? Mushrooms. ? Mussels. ? Beer.  Stay at a healthy weight. If you want to lose weight, talk with your doctor. Do not lose weight too fast.  Start or continue an exercise plan as told by your doctor. Eating and drinking  Drink enough fluids to keep your pee (urine) pale yellow.  If you drink alcohol: ? Limit how much you use to:  0-1 drink a day for women.  0-2 drinks a day for men. ? Be aware of how much alcohol is in your drink. In the [place] one drink equals one 12 oz bottle of beer (355 mL), one 5 oz glass of wine (148 mL), or one 1  oz glass of hard liquor (44 mL). General instructions  Take over-the-counter and prescription medicines only as told by your doctor.  Do not drive or use heavy machinery while taking prescription pain medicine.  Return to your normal activities as told by your doctor. Ask your doctor what activities are safe for you.  Keep all follow-up visits as told by your doctor. This is important. Contact a doctor if:  You have another gout attack.  You still have symptoms of a gout attack after 10 days of treatment.  You have problems (side effects) because of your medicines.  You have chills or a fever.  You have burning pain when you pee (urinate).  You have pain in your lower back or belly. Get help right away if:  You have very bad pain.  Your pain cannot be controlled.  You cannot pee. Summary  Gout is painful swelling of the joints.  The most common site of pain is the big toe, but it can affect other joints.  Medicines and avoiding some foods can help to prevent and treat gout attacks. This information is not intended to replace advice given to you by your health care provider. Make sure you discuss any questions you have with your health care provider. Document Revised: 01/09/2018 Document Reviewed: 01/09/2018 Elsevier Patient Education  2020 [employer].

## 2020-02-24 ENCOUNTER — Encounter: Payer: Self-pay | Admitting: Family Medicine

## 2020-02-24 ENCOUNTER — Telehealth: Payer: Self-pay | Admitting: Medical

## 2020-02-24 NOTE — Telephone Encounter (Signed)
Pt called and is requesting a out of work note states he is still having the gout flare up  Pt would like it for 2 or 3 days pt can be reached at [phone] when ready

## 2020-03-16 ENCOUNTER — Other Ambulatory Visit: Payer: Self-pay

## 2020-03-16 ENCOUNTER — Ambulatory Visit: Payer: 59 | Admitting: Medical

## 2020-03-16 ENCOUNTER — Encounter: Payer: Self-pay | Admitting: Medical

## 2020-03-16 VITALS — BP 136/80 | HR 87 | Ht 75.0 in | Wt 351.6 lb

## 2020-03-16 DIAGNOSIS — K219 Gastro-esophageal reflux disease without esophagitis: Secondary | ICD-10-CM

## 2020-03-16 DIAGNOSIS — R351 Nocturia: Secondary | ICD-10-CM

## 2020-03-16 DIAGNOSIS — Z7185 Encounter for immunization safety counseling: Secondary | ICD-10-CM

## 2020-03-16 DIAGNOSIS — R7989 Other specified abnormal findings of blood chemistry: Secondary | ICD-10-CM | POA: Diagnosis not present

## 2020-03-16 DIAGNOSIS — E669 Obesity, unspecified: Secondary | ICD-10-CM

## 2020-03-16 DIAGNOSIS — Z23 Encounter for immunization: Secondary | ICD-10-CM | POA: Diagnosis not present

## 2020-03-16 DIAGNOSIS — Z0000 Encounter for general adult medical examination without abnormal findings: Secondary | ICD-10-CM | POA: Diagnosis not present

## 2020-03-16 DIAGNOSIS — Z136 Encounter for screening for cardiovascular disorders: Secondary | ICD-10-CM

## 2020-03-16 DIAGNOSIS — Z1211 Encounter for screening for malignant neoplasm of colon: Secondary | ICD-10-CM | POA: Diagnosis not present

## 2020-03-16 DIAGNOSIS — M722 Plantar fascial fibromatosis: Secondary | ICD-10-CM

## 2020-03-16 DIAGNOSIS — E785 Hyperlipidemia, unspecified: Secondary | ICD-10-CM

## 2020-03-16 DIAGNOSIS — M791 Myalgia, unspecified site: Secondary | ICD-10-CM

## 2020-03-16 DIAGNOSIS — M2550 Pain in unspecified joint: Secondary | ICD-10-CM | POA: Insufficient documentation

## 2020-03-16 DIAGNOSIS — Z125 Encounter for screening for malignant neoplasm of prostate: Secondary | ICD-10-CM

## 2020-03-16 DIAGNOSIS — E041 Nontoxic single thyroid nodule: Secondary | ICD-10-CM

## 2020-03-16 DIAGNOSIS — R1314 Dysphagia, pharyngoesophageal phase: Secondary | ICD-10-CM

## 2020-03-16 DIAGNOSIS — R7301 Impaired fasting glucose: Secondary | ICD-10-CM

## 2020-03-16 DIAGNOSIS — Z7189 Other specified counseling: Secondary | ICD-10-CM

## 2020-03-16 DIAGNOSIS — M109 Gout, unspecified: Secondary | ICD-10-CM

## 2020-03-16 NOTE — Patient Instructions (Signed)
Health screening: See your eye doctor yearly for routine vision care. See your dentist yearly for routine dental care including hygiene visits twice yearly.    Vaccines: You are up-to-date on Covid vaccine.  We recommend a booster in December which will be 8 months from your last shot  Shingles vaccine:  I recommend you have a shingles vaccine to help prevent shingles or herpes zoster outbreak.   Please call your insurer to inquire about coverage for the Shingrix vaccine given in 2 doses.   Some insurers cover this vaccine after age 68, some cover this after age 17.  If your insurer covers this, then call to schedule appointment to have this vaccine here.  I recommend a pneumococcal 23 vaccine.  You can return for this at your convenience.   Counseled on the influenza virus vaccine.  Vaccine information sheet given.  Influenza vaccine given after consent obtained.  Counseled on the Td (tetanus, diptheria) vaccine.  Vaccine information sheet given. Td vaccine given after consent obtained.     Cancer screening: Prostate cancer screening-we will check a PSA blood test today  You are due for colon cancer screening.  Discussed Cologuard vs colonoscopy  Check your skin regularly for changing moles or new skin lesions   Heart disease screening Given your risk factors for heart disease, it may be worth a baseline stress test or cardiology evaluation.   EKG reviewed today.      Problem list concerns: Impaired glucose-labs today, continues on metformin once daily  Hyperlipidemia-fasting labs today, not currently on therapy  Obesity-I recommend you work on losing weight through healthy eating and exercise.  Consider intermittent fasting.  Thyroid nodule 2015, small enough not meeting criteria for biopsy -exam findings today unremarkable, no specific indication for repeat ultrasound.  Thyroid labs today  GERD, history of esophageal stricture and prior endoscopy- continue  Omeprazole  Elevated liver tests, negative for hepatitis A, B, and C September 2020.  Consider ultrasound of abdomen.  Iron level today for screening for hemachromatosis.  Gout flare recently - baseline uric acid lab today  joint pain and myalgias - labs today as below.   Stretch daily.     Intermittent fasting (IF) is an eating pattern that cycles between periods of fasting and eating. It's currently very popular in the health and fitness community.  Intermittent Fasting Methods There are several different ways of doing intermittent fasting -- all of which involve splitting the day or week into eating and fasting periods.  During the fasting periods, you eat either very little or nothing at all.  These are the most popular methods:  The 16/8 method involves skipping breakfast and restricting your daily eating period to 8 hours, such as 1-9 p.m. Then you fast for 16 hours in between.  Eat-Stop-Eat: This involves fasting for 24 hours, once or twice a week, for example by not eating from dinner one day until dinner the next day.  The 5:2 diet: With this method, you consume only 500-600 calories on two nonconsecutive days of the week, but eat normally the other 5 days.  By reducing your calorie intake, all of these methods should cause weight loss as long as you don't compensate by eating much more during the eating periods.  Many people find the 16/8 method to be the simplest, most sustainable and easiest to stick to. It's also the most popular.

## 2020-03-16 NOTE — Progress Notes (Signed)
Subjective:   HPI  Nathaniel Hahn is a 54 y.o. male who presents for Chief Complaint  Patient presents with  . Annual Exam    with fasting labs     Patient Care Team: Retal Tonkinson, Cleda Mccreedy as PCP - General (Family Medicine) Dr. Yancey Flemings, gastroenterology Dr. Betha Loa, orthopedic surgery Ophthalmologist  Dentist, Dr. Catha Brow   Concerns: Not sleeping as well as he would like.   Having to get up at night to urinate more.  Sometimes has GERD issues or hip pains.   Nocturia is random, not every night.    Had recent gout flare left great toe, saw Dr. Susann Givens here.  Ibuprofen + colchicine prescribed recently helped.    Exercising - walking a lot on the job, active, food delivery with 2 jobs  Diet - doing better.  Not where diet should be but improving.    Takes ibuprofen 3-4 tablets daily for soreness in muscles, hip pain.  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  Aches and sore from daily activity, takes ibuprofen almost daily  Reviewed their medical, surgical, family, social, medication, and allergy history and updated chart as appropriate.  Past Medical History:  Diagnosis Date  . Allergy   . Distal radius fracture, right 08/2015   FOOSH type injury  . Hearing loss    history of working around loud machinery  . History of sinusitis    yearly  . Leg pain, diffuse   . Meningitis    6 months old  . Plantar fasciitis     Past Surgical History:  Procedure Laterality Date  . APPENDECTOMY    . ESOPHAGOGASTRODUODENOSCOPY (EGD) WITH PROPOFOL N/A 07/27/2016   Procedure: ESOPHAGOGASTRODUODENOSCOPY (EGD) WITH PROPOFOL; with Dilation;  Surgeon: Hilarie Fredrickson, MD;  Location: [facility];  Service: Endoscopy;  Laterality: N/A;  . OPEN REDUCTION INTERNAL FIXATION (ORIF) DISTAL RADIAL FRACTURE Right 08/03/2015   Procedure: OPEN REDUCTION INTERNAL FIXATION (ORIF) RIGHT DISTAL RADIUS FRACTURE;   Surgeon: Betha Loa, MD;  Location: [facility];  Service: Orthopedics;  Laterality: Right;  Marland Kitchen VASECTOMY    . WISDOM TOOTH EXTRACTION      Family History  Problem Relation Age of Onset  . COPD Mother   . Hiatal hernia Mother   . Colon polyps Mother   . Asthma Sister   . Stroke Sister   . Heart disease Neg Hx   . Hyperlipidemia Neg Hx   . Hypertension Neg Hx   . Cancer Neg Hx   . Colon cancer Neg Hx      Current Outpatient Medications:  .  colchicine 0.6 MG tablet, Take 1 tablet (0.6 mg total) by mouth 2 (two) times daily., Disp: 14 tablet, Rfl: 0 .  ibuprofen (ADVIL,MOTRIN) 200 MG tablet, Take 200 mg by mouth every 6 (six) hours as needed for moderate pain. , Disp: , Rfl:  .  metFORMIN (GLUCOPHAGE) 500 MG tablet, Take 1 tablet (500 mg total) by mouth daily with breakfast. TAKE 1 TABLET TWICE A DAY WITH A MEAL OR FOOD FOR DIABETES, Disp: 180 tablet, Rfl: 3 .  Multiple Vitamin (MULTIVITAMIN) tablet, Take 1 tablet by mouth daily., Disp: , Rfl:  .  omeprazole (PRILOSEC) 20 MG capsule, Take 1 capsule (20 mg total) by mouth daily., Disp: 30 capsule, Rfl: 3  Allergies  Allergen Reactions  . Onion Nausea And Vomiting  . Penicillins Rash    Has patient had  a PCN reaction causing immediate rash, facial/tongue/throat swelling, SOB or lightheadedness with hypotension: {unknown Has patient had a PCN reaction causing severe rash involving mucus membranes or skin necrosis: {no Has patient had a PCN reaction that required hospitalization {no Has patient had a PCN reaction occurring within the last 10 years: {no If all of the above answers are "NO", then may proceed with Cephalosporin use.       Review of Systems Constitutional: -fever, -chills, -sweats, -unexpected weight change, -decreased appetite, -fatigue Allergy: -sneezing, -itching, -congestion Dermatology: -changing moles, --rash, -lumps ENT: -runny nose, -ear pain, -sore throat, -hoarseness, -sinus pain, -teeth  pain, - ringing in ears, -hearing loss, -nosebleeds Cardiology: -chest pain, -palpitations, -swelling, -difficulty breathing when lying flat, -waking up short of breath Respiratory: -cough, -shortness of breath, -difficulty breathing with exercise or exertion, -wheezing, -coughing up blood Gastroenterology: -abdominal pain, -nausea, -vomiting, -diarrhea, -constipation, -blood in stool, -changes in bowel movement, -difficulty swallowing or eating Hematology: -bleeding, -bruising  Musculoskeletal: -joint aches, -muscle aches, -joint swelling, -back pain, -neck pain, -cramping, -changes in gait Ophthalmology: denies vision changes, eye redness, itching, discharge Urology: -burning with urination, -difficulty urinating, -blood in urine, -urinary frequency, -urgency, -incontinence Neurology: -headache, -weakness, -tingling, -numbness, -memory loss, -falls, -dizziness Psychology: -depressed mood, -agitation, -sleep problems Male GU: no testicular mass, pain, no lymph nodes swollen, no swelling, no rash.     Objective:  BP 136/80   Pulse 87   Ht 6\' 3"  (1.905 m)   Wt (!) 351 lb 9.6 oz (159.5 kg)   SpO2 94%   BMI 43.95 kg/m   General appearance: alert, no distress, WD/WN, Caucasian male Skin: few small bruises lower anterior legs, but no worrisome lesions HEENT: normocephalic, conjunctiva/corneas normal, sclerae anicteric, PERRLA, EOMi, nares patent, no discharge or erythema, pharynx normal Oral cavity: MMM, tongue normal, teeth in good repair Neck: supple, no lymphadenopathy, no thyromegaly, no masses, normal ROM, no bruits Chest: non tender, normal shape and expansion Heart: RRR, normal S1, S2, no murmurs Lungs: CTA bilaterally, no wheezes, rhonchi, or rales Abdomen: +bs, soft, non tender, non distended, no masses, no hepatomegaly, no splenomegaly, no bruits Back: non tender, normal ROM, no scoliosis Musculoskeletal: upper extremities non tender, no obvious deformity, normal ROM throughout,  lower extremities non tender, no obvious deformity, normal ROM throughout Extremities: no edema, no cyanosis, no clubbing Pulses: 2+ symmetric, upper and lower extremities, normal cap refill Neurological: alert, oriented x 3, CN2-12 intact, strength normal upper extremities and lower extremities, sensation normal throughout, DTRs 2+ throughout, no cerebellar signs, gait normal Psychiatric: normal affect, behavior normal, pleasant  GU: normal male external genitalia, nontender, no masses, no hernia, no lymphadenopathy Rectal: declined   EKG Rate 78 bpm, PR 168 ms, QRS 108 ms, QTC 437 ms, axis 51 degrees, normal sinus rhythm    Assessment and Plan :   Encounter Diagnoses  Name Primary?  . Encounter for health maintenance examination in adult Yes  . Hyperlipidemia, unspecified hyperlipidemia type   . Screen for colon cancer   . Elevated LFTs   . Impaired fasting blood sugar   . Pharyngoesophageal dysphagia   . Gastroesophageal reflux disease, unspecified whether esophagitis present   . Vaccine counseling   . Screening for prostate cancer   . Obesity, unspecified classification, unspecified obesity type, unspecified whether serious comorbidity present   . Plantar fasciitis   . Thyroid nodule   . Need for Td vaccine   . Need for influenza vaccination   . Myalgia   . Arthralgia,  unspecified joint   . Screening for heart disease   . Gout involving toe, unspecified cause, unspecified chronicity, unspecified laterality   . Nocturia     Physical exam - discussed and counseled on healthy lifestyle, diet, exercise, preventative care, vaccinations, sick and well care, proper use of emergency dept and after hours care, and addressed their concerns.    Health screening: See your eye doctor yearly for routine vision care. See your dentist yearly for routine dental care including hygiene visits twice yearly.    Vaccines: You are up-to-date on Covid vaccine.  We recommend a booster in  December which will be 8 months from your last shot  You are due for Td tetanus diphtheria booster  I recommend a yearly flu shot  Shingles vaccine:  I recommend you have a shingles vaccine to help prevent shingles or herpes zoster outbreak.   Please call your insurer to inquire about coverage for the Shingrix vaccine given in 2 doses.   Some insurers cover this vaccine after age 55, some cover this after age 55.  If your insurer covers this, then call to schedule appointment to have this vaccine here.  I recommend a pneumococcal 23 vaccine   Counseled on the influenza virus vaccine.  Vaccine information sheet given.  Influenza vaccine given after consent obtained.  Counseled on the Td (tetanus, diptheria) vaccine.  Vaccine information sheet given. Td vaccine given after consent obtained.     Cancer screening: Prostate cancer screening-we will check a PSA blood test today  You are due for colon cancer screening.  Discussed Cologuard vs colonoscopy  Check your skin regularly for changing moles or new skin lesions   Heart disease screening Given your risk factors for heart disease, it may be worth a baseline stress test or cardiology evaluation.   EKG reviewed today.      Problem list concerns: Impaired glucose-labs today, continues on metformin once daily  Hyperlipidemia-fasting labs today, not currently on therapy  Obesity-counseled on need to lose weight through healthy eating and exercise.  We discussed strategies  Thyroid nodule 2015, small enough not meeting criteria for biopsy -exam findings today unremarkable, no specific indication for repeat ultrasound.  Thyroid labs today  GERD, history of esophageal stricture and prior endoscopy- uses PPI.  Elevated LFTs-negative for hepatitis A, B, and C September 2020.  Consider ultrasound.  Iron level today for screening.  Gout flare recently - baseline uric acid today  joint pain and myalgias - labs today as below.  He notes  mattress only a few years old.  Advised stretching.  Advised tylenol prn, not NSAID daily    Leonette MostCharles was seen today for annual exam.  Diagnoses and all orders for this visit:  Encounter for health maintenance examination in adult -     Comprehensive metabolic panel -     CBC with Differential/Platelet -     Hemoglobin A1c -     Lipid panel -     PSA -     EKG 12-Lead -     US Abdomen Complete; Future -     Iron -     TSH -     T4, free -     CK -     Uric acid  Hyperlipidemia, unspecified hyperlipidemia type -     Lipid panel  Screen for colon cancer  Elevated LFTs -     US Abdomen Complete; Future -     Iron  Impaired fasting blood sugar -  Hemoglobin A1c  Pharyngoesophageal dysphagia  Gastroesophageal reflux disease, unspecified whether esophagitis present  Vaccine counseling  Screening for prostate cancer -     PSA  Obesity, unspecified classification, unspecified obesity type, unspecified whether serious comorbidity present -     TSH -     T4, free  Plantar fasciitis  Thyroid nodule -     TSH -     T4, free  Need for Td vaccine  Need for influenza vaccination  Myalgia -     CK  Arthralgia, unspecified joint -     CK -     Uric acid  Screening for heart disease -     EKG 12-Lead  Gout involving toe, unspecified cause, unspecified chronicity, unspecified laterality -     CK -     Uric acid  Nocturia  Other orders -     Td : Tetanus/diphtheria >7yo Preservative  free -     Flu Vaccine QUAD 6+ mos PF IM (Fluarix Quad PF)    Follow-up pending labs, yearly for physical

## 2020-03-17 ENCOUNTER — Other Ambulatory Visit: Payer: Self-pay | Admitting: Medical

## 2020-03-17 ENCOUNTER — Encounter: Payer: Self-pay | Admitting: Medical

## 2020-03-17 DIAGNOSIS — M109 Gout, unspecified: Secondary | ICD-10-CM

## 2020-03-17 LAB — COMPREHENSIVE METABOLIC PANEL
ALT: 34 IU/L (ref 0–44)
AST: 19 IU/L (ref 0–40)
Albumin/Globulin Ratio: 1.6 (ref 1.2–2.2)
Albumin: 4.4 g/dL (ref 3.8–4.9)
Alkaline Phosphatase: 99 IU/L (ref 44–121)
BUN/Creatinine Ratio: 20 (ref 9–20)
BUN: 19 mg/dL (ref 6–24)
Bilirubin Total: 0.4 mg/dL (ref 0.0–1.2)
CO2: 23 mmol/L (ref 20–29)
Calcium: 9.4 mg/dL (ref 8.7–10.2)
Chloride: 103 mmol/L (ref 96–106)
Creatinine, Ser: 0.93 mg/dL (ref 0.76–1.27)
GFR calc Af Amer: 106 mL/min/{1.73_m2} (ref >59)
GFR calc non Af Amer: 92 mL/min/{1.73_m2} (ref >59)
Globulin, Total: 2.7 g/dL (ref 1.5–4.5)
Glucose: 118 mg/dL — ABNORMAL HIGH (ref 65–99)
Potassium: 3.5 mmol/L (ref 3.5–5.2)
Sodium: 139 mmol/L (ref 134–144)
Total Protein: 7.1 g/dL (ref 6.0–8.5)

## 2020-03-17 LAB — CBC WITH DIFFERENTIAL/PLATELET
Basophils Absolute: 0.1 10*3/uL (ref 0.0–0.2)
Basos: 1 %
EOS (ABSOLUTE): 0.2 10*3/uL (ref 0.0–0.4)
Eos: 3 %
Hematocrit: 44.7 % (ref 37.5–51.0)
Hemoglobin: 16 g/dL (ref 13.0–17.7)
Immature Grans (Abs): 0 10*3/uL (ref 0.0–0.1)
Immature Granulocytes: 0 %
Lymphocytes Absolute: 1.5 10*3/uL (ref 0.7–3.1)
Lymphs: 17 %
MCH: 31.3 pg (ref 26.6–33.0)
MCHC: 35.8 g/dL — ABNORMAL HIGH (ref 31.5–35.7)
MCV: 88 fL (ref 79–97)
Monocytes Absolute: 0.7 10*3/uL (ref 0.1–0.9)
Monocytes: 7 %
Neutrophils Absolute: 6.5 10*3/uL (ref 1.4–7.0)
Neutrophils: 72 %
Platelets: 243 10*3/uL (ref 150–450)
RBC: 5.11 x10E6/uL (ref 4.14–5.80)
RDW: 12.9 % (ref 11.6–15.4)
WBC: 9 10*3/uL (ref 3.4–10.8)

## 2020-03-17 LAB — URIC ACID: Uric Acid: 8.6 mg/dL — ABNORMAL HIGH (ref 3.8–8.4)

## 2020-03-17 LAB — TSH: TSH: 2.31 u[IU]/mL (ref 0.450–4.500)

## 2020-03-17 LAB — PSA: Prostate Specific Ag, Serum: 1.1 ng/mL (ref 0.0–4.0)

## 2020-03-17 LAB — CK: Total CK: 37 U/L — ABNORMAL LOW (ref 41–331)

## 2020-03-17 LAB — LIPID PANEL
Chol/HDL Ratio: 5.1 ratio — ABNORMAL HIGH (ref 0.0–5.0)
Cholesterol, Total: 179 mg/dL (ref 100–199)
HDL: 35 mg/dL — ABNORMAL LOW (ref >39)
LDL Chol Calc (NIH): 118 mg/dL — ABNORMAL HIGH (ref 0–99)
Triglycerides: 145 mg/dL (ref 0–149)
VLDL Cholesterol Cal: 26 mg/dL (ref 5–40)

## 2020-03-17 LAB — T4, FREE: Free T4: 1.21 ng/dL (ref 0.82–1.77)

## 2020-03-17 LAB — HEMOGLOBIN A1C
Est. average glucose Bld gHb Est-mCnc: 123 mg/dL
Hgb A1c MFr Bld: 5.9 % — ABNORMAL HIGH (ref 4.8–5.6)

## 2020-03-17 LAB — IRON: Iron: 84 ug/dL (ref 38–169)

## 2020-03-17 MED ORDER — COLCHICINE 0.6 MG PO TABS: 0.6000 mg | ORAL_TABLET | Freq: Two times a day (BID) | ORAL | 1 refills | Status: DC | PRN

## 2020-03-17 MED ORDER — ALLOPURINOL 100 MG PO TABS: 100.0000 mg | ORAL_TABLET | Freq: Every day | ORAL | 0 refills | Status: DC

## 2020-03-17 MED ORDER — METFORMIN HCL 850 MG PO TABS: 850.0000 mg | ORAL_TABLET | Freq: Two times a day (BID) | ORAL | 3 refills | Status: DC

## 2020-03-17 MED ORDER — ROSUVASTATIN CALCIUM 10 MG PO TABS: 10.0000 mg | ORAL_TABLET | Freq: Every day | ORAL | 3 refills | Status: DC

## 2020-03-17 NOTE — Progress Notes (Signed)
.  all

## 2020-03-19 ENCOUNTER — Other Ambulatory Visit: Payer: 59

## 2020-12-13 ENCOUNTER — Ambulatory Visit (INDEPENDENT_AMBULATORY_CARE_PROVIDER_SITE_OTHER): Payer: 59 | Admitting: Medical

## 2020-12-13 ENCOUNTER — Encounter: Payer: Self-pay | Admitting: Medical

## 2020-12-13 VITALS — BP 130/78 | HR 81 | Ht 75.0 in | Wt 354.6 lb

## 2020-12-13 DIAGNOSIS — R7303 Prediabetes: Secondary | ICD-10-CM | POA: Diagnosis not present

## 2020-12-13 DIAGNOSIS — Z23 Encounter for immunization: Secondary | ICD-10-CM | POA: Diagnosis not present

## 2020-12-13 DIAGNOSIS — R7301 Impaired fasting glucose: Secondary | ICD-10-CM | POA: Diagnosis not present

## 2020-12-13 DIAGNOSIS — E790 Hyperuricemia without signs of inflammatory arthritis and tophaceous disease: Secondary | ICD-10-CM

## 2020-12-13 DIAGNOSIS — E785 Hyperlipidemia, unspecified: Secondary | ICD-10-CM

## 2020-12-13 DIAGNOSIS — M25561 Pain in right knee: Secondary | ICD-10-CM | POA: Insufficient documentation

## 2020-12-13 LAB — POCT GLYCOSYLATED HEMOGLOBIN (HGB A1C)
Hemoglobin A1C: 5.5 % (ref 4.0–5.6)
Hemoglobin A1C: 5.5 % (ref 4.0–5.6)

## 2020-12-13 MED ORDER — OMEPRAZOLE 20 MG PO CPDR: 20.0000 mg | DELAYED_RELEASE_CAPSULE | Freq: Every day | ORAL | 3 refills | Status: DC

## 2020-12-13 MED ORDER — ALLOPURINOL 100 MG PO TABS: 100.0000 mg | ORAL_TABLET | Freq: Every day | ORAL | 1 refills | Status: DC

## 2020-12-13 NOTE — Progress Notes (Signed)
Subjective:  Nathaniel Hahn is a 55 y.o. male who presents for Chief Complaint  Patient presents with   Medication Management    Med check. Patient concerned about right knee pain      Here for follow-up and med check.  At his last visit we started Crestor.  He is taking this without complaint  Last visit he was prediabetic.  He has been walking a lot. On average, walks about 8000-10000 per day.  He still notes that he has trouble with stress eating.  The last few weeks he had a couple weddings he attended which did not help the diet.  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  Having trouble finding of the meaningful work so he does need to work 2 jobs.  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  Eats out a few nights per week.  If eats meat it is mainly chicken, fish or pork  He is working 2 jobs, Building control surveyor.  No other aggravating or relieving factors.    No other c/o.  Past Medical History:  Diagnosis Date   Allergy    Distal radius fracture, right 08/2015   FOOSH type injury   Hearing loss    history of working around loud machinery   History of sinusitis    yearly   Leg pain, diffuse    Meningitis    6 months old   Plantar fasciitis    Current Outpatient Medications on File Prior to Visit  Medication Sig Dispense Refill   colchicine 0.6 MG tablet Take 1 tablet (0.6 mg total) by mouth 2 (two) times daily as needed. 30 tablet 1   ibuprofen (ADVIL,MOTRIN) 200 MG tablet Take 200 mg by mouth every 6 (six) hours as needed for moderate pain.      metFORMIN (GLUCOPHAGE) 850 MG tablet Take 1 tablet (850 mg total) by mouth 2 (two) times daily with a meal. 180 tablet 3   Multiple Vitamin (MULTIVITAMIN) tablet Take 1 tablet by mouth daily.     rosuvastatin  (CRESTOR) 10 MG tablet Take 1 tablet (10 mg total) by mouth daily. 90 tablet 3   No current facility-administered medications on file prior to visit.     The following portions of the patient's history were reviewed and updated as appropriate: allergies, current medications, past family history, past medical history, past social history, past surgical history and problem list.  ROS Otherwise as in subjective above    Objective: BP 130/78   Pulse 81   Ht 6\' 3"  (1.905 m)   Wt (!) 354 lb 9.6 oz (160.8 kg)   SpO2 94%   BMI 44.32 kg/m   General appearance: alert, no distress, well developed, well nourished Neck: supple, no lymphadenopathy, no thyromegaly, no masses Heart: RRR, normal S1, S2, no murmurs Lungs: CTA bilaterally, no wheezes, rhonchi, or rales Abdomen: +bs, soft, non tender, non distended, no masses, no hepatomegaly, no splenomegaly Pulses: 2+ radial pulses, 2+ pedal pulses, normal cap refill Ext: no edema     Assessment: Encounter Diagnoses  Name Primary?   Hyperlipidemia, unspecified hyperlipidemia type Yes   Impaired fasting blood sugar    Elevated uric acid in blood    Need for COVID-19 vaccine  Plan: Hyper lipidemia-continue statin, continue low-cholesterol diet, limit meat and processed foods  Impaired gluc ose-continue efforts to cut out more carbs and sugars.  Continue efforts to lose weight.  Continue regular exercise  Elevated uric acid-we discussed the reasoning behind adding allopurinol for prevention and to protect the kidneys as his uric acid level was over 8 last visit.  Counseled on the Covid virus vaccine.  Vaccine information sheet given.  Covid vaccine given after consent obtained.   [name] was seen today for medication management.  Diagnoses and all orders for this visit:  Hyperlipidemia, unspecified hyperlipidemia type  Impaired fasting blood sugar -     HgB A1c  Elevated uric acid in blood  Need for COVID-19 vaccine -      PFIZER Comirnaty(GRAY TOP)COVID-19 Vaccine  Other orders -     omeprazole (PRILOSEC) 20 MG capsule; Take 1 capsule (20 mg total) by mouth daily. -     allopurinol (ZYLOPRIM) 100 MG tablet; Take 1 tablet (100 mg total) by mouth daily.   Follow up: pending labs, 03/2021 for physical

## 2020-12-13 NOTE — Addendum Note (Signed)
Addended by: Jac Canavan on: 12/13/2020 02:37 PM   Modules accepted: Orders

## 2021-02-09 ENCOUNTER — Encounter: Payer: Self-pay | Admitting: Internal Medicine

## 2021-03-11 ENCOUNTER — Telehealth: Payer: Self-pay | Admitting: Internal Medicine

## 2021-03-18 NOTE — Telephone Encounter (Signed)
Lm on vm that patient should continue taking Omeprazole if it is helping him but that he is overdue for an office visit and to call when he has a chance to schedule one just to check in with Dr. Marina Goodell.

## 2021-03-18 NOTE — Telephone Encounter (Signed)
Pt called to f/u on the message below. Pls call him.

## 2021-04-07 ENCOUNTER — Encounter: Payer: 59 | Admitting: Medical

## 2021-04-25 ENCOUNTER — Other Ambulatory Visit: Payer: Self-pay | Admitting: Medical

## 2021-04-25 NOTE — Telephone Encounter (Signed)
Has an appt in december 

## 2021-06-23 ENCOUNTER — Other Ambulatory Visit: Payer: Self-pay

## 2021-06-23 ENCOUNTER — Encounter: Payer: Self-pay | Admitting: Medical

## 2021-06-23 ENCOUNTER — Ambulatory Visit (INDEPENDENT_AMBULATORY_CARE_PROVIDER_SITE_OTHER): Payer: 59 | Admitting: Medical

## 2021-06-23 VITALS — BP 120/70 | HR 82 | Ht 74.0 in | Wt 357.0 lb

## 2021-06-23 DIAGNOSIS — E790 Hyperuricemia without signs of inflammatory arthritis and tophaceous disease: Secondary | ICD-10-CM | POA: Diagnosis not present

## 2021-06-23 DIAGNOSIS — Z7185 Encounter for immunization safety counseling: Secondary | ICD-10-CM | POA: Diagnosis not present

## 2021-06-23 DIAGNOSIS — I8393 Asymptomatic varicose veins of bilateral lower extremities: Secondary | ICD-10-CM | POA: Insufficient documentation

## 2021-06-23 DIAGNOSIS — Z125 Encounter for screening for malignant neoplasm of prostate: Secondary | ICD-10-CM

## 2021-06-23 DIAGNOSIS — E669 Obesity, unspecified: Secondary | ICD-10-CM

## 2021-06-23 DIAGNOSIS — Z23 Encounter for immunization: Secondary | ICD-10-CM | POA: Diagnosis not present

## 2021-06-23 DIAGNOSIS — R7301 Impaired fasting glucose: Secondary | ICD-10-CM

## 2021-06-23 DIAGNOSIS — G479 Sleep disorder, unspecified: Secondary | ICD-10-CM

## 2021-06-23 DIAGNOSIS — Z1211 Encounter for screening for malignant neoplasm of colon: Secondary | ICD-10-CM

## 2021-06-23 DIAGNOSIS — E041 Nontoxic single thyroid nodule: Secondary | ICD-10-CM

## 2021-06-23 DIAGNOSIS — E785 Hyperlipidemia, unspecified: Secondary | ICD-10-CM

## 2021-06-23 DIAGNOSIS — Z136 Encounter for screening for cardiovascular disorders: Secondary | ICD-10-CM

## 2021-06-23 DIAGNOSIS — Z0000 Encounter for general adult medical examination without abnormal findings: Secondary | ICD-10-CM

## 2021-06-23 DIAGNOSIS — R351 Nocturia: Secondary | ICD-10-CM

## 2021-06-23 DIAGNOSIS — K219 Gastro-esophageal reflux disease without esophagitis: Secondary | ICD-10-CM

## 2021-06-23 NOTE — Progress Notes (Signed)
Subjective:   HPI  Nathaniel Hahn is a 55 y.o. male who presents for Chief Complaint  Patient presents with   fasting cpe.     Fasting cpe, no concerns    Patient Care Team: Nathaniel Hahn, Cleda Mccreedy as PCP - General (Family Medicine) Dr. Yancey Flemings, gastroenterology Dr. Betha Loa, orthopedic surgery Ophthalmologist  Dentist, Dr. Catha Brow   Concerns: Not sleeping as well as he would like.   Having to get up at night to urinate more.   Compliant with medications except not taking allopurinol or colchicine.   No recent problems   Reviewed their medical, surgical, family, social, medication, and allergy history and updated chart as appropriate.  Past Medical History:  Diagnosis Date   Allergy    Distal radius fracture, right 08/2015   FOOSH type injury   Hearing loss    history of working around loud machinery   History of sinusitis    yearly   Leg pain, diffuse    Meningitis    6 months old   Plantar fasciitis     Past Surgical History:  Procedure Laterality Date   APPENDECTOMY     ESOPHAGOGASTRODUODENOSCOPY (EGD) WITH PROPOFOL N/A 07/27/2016   Procedure: ESOPHAGOGASTRODUODENOSCOPY (EGD) WITH PROPOFOL; with Dilation;  Surgeon: Hilarie Fredrickson, MD;  Location: [facility];  Service: Endoscopy;  Laterality: N/A;   OPEN REDUCTION INTERNAL FIXATION (ORIF) DISTAL RADIAL FRACTURE Right 08/03/2015   Procedure: OPEN REDUCTION INTERNAL FIXATION (ORIF) RIGHT DISTAL RADIUS FRACTURE;  Surgeon: Betha Loa, MD;  Location: [facility];  Service: Orthopedics;  Laterality: Right;   VASECTOMY     WISDOM TOOTH EXTRACTION      Family History  Problem Relation Age of Onset   COPD Mother    Hiatal hernia Mother    Colon polyps Mother    Asthma Sister    Stroke Sister    Heart disease Neg Hx    Hyperlipidemia Neg Hx    Hypertension Neg Hx    Cancer Neg Hx    Colon cancer Neg Hx      Current Outpatient Medications:    ibuprofen (ADVIL,MOTRIN) 200 MG tablet, Take 200  mg by mouth every 6 (six) hours as needed for moderate pain. , Disp: , Rfl:    Multiple Vitamin (MULTIVITAMIN) tablet, Take 1 tablet by mouth daily., Disp: , Rfl:    omeprazole (PRILOSEC) 20 MG capsule, Take 1 capsule (20 mg total) by mouth daily., Disp: 30 capsule, Rfl: 3   rosuvastatin (CRESTOR) 10 MG tablet, TAKE ONE TABLET BY MOUTH ONE TIME DAILY, Disp: 90 tablet, Rfl: 0   allopurinol (ZYLOPRIM) 100 MG tablet, Take 1 tablet (100 mg total) by mouth daily. (Patient not taking: Reported on 06/23/2021), Disp: 90 tablet, Rfl: 1   colchicine 0.6 MG tablet, Take 1 tablet (0.6 mg total) by mouth 2 (two) times daily as needed. (Patient not taking: Reported on 06/23/2021), Disp: 30 tablet, Rfl: 1   metFORMIN (GLUCOPHAGE) 850 MG tablet, Take 1 tablet (850 mg total) by mouth 2 (two) times daily with a meal., Disp: 180 tablet, Rfl: 3  Allergies  Allergen Reactions   Onion Nausea And Vomiting   Penicillins Rash    Has patient had a PCN reaction causing immediate rash, facial/tongue/throat swelling, SOB or lightheadedness with hypotension: {unknown Has patient had a PCN reaction causing severe rash involving mucus membranes or skin necrosis: {no Has patient had a PCN reaction that required hospitalization {no Has patient had a PCN reaction occurring within  the last 10 years: {no If all of the above answers are "NO", then may proceed with Cephalosporin use.     Review of Systems Constitutional: -fever, -chills, -sweats, -unexpected weight change, -decreased appetite, -fatigue Allergy: -sneezing, -itching, -congestion Dermatology: -changing moles, --rash, -lumps ENT: -runny nose, -ear pain, -sore throat, -hoarseness, -sinus pain, -teeth pain, - ringing in ears, -hearing loss, -nosebleeds Cardiology: -chest pain, -palpitations, -swelling, -difficulty breathing when lying flat, -waking up short of breath Respiratory: -cough, -shortness of breath, -difficulty breathing with exercise or exertion, -wheezing,  -coughing up blood Gastroenterology: -abdominal pain, -nausea, -vomiting, -diarrhea, -constipation, -blood in stool, -changes in bowel movement, -difficulty swallowing or eating Hematology: -bleeding, -bruising  Musculoskeletal: -joint aches, -muscle aches, -joint swelling, -back pain, -neck pain, -cramping, -changes in gait Ophthalmology: denies vision changes, eye redness, itching, discharge Urology: -burning with urination, -difficulty urinating, -blood in urine, -urinary frequency, -urgency, -incontinence Neurology: -headache, -weakness, -tingling, -numbness, -memory loss, -falls, -dizziness Psychology: -depressed mood, -agitation, -sleep problems Male GU: no testicular mass, pain, no lymph nodes swollen, no swelling, no rash.     Objective:  BP 120/70    Pulse 82    Ht 6\' 2"  (1.88 m)    Wt (!) 357 lb (161.9 kg)    BMI 45.84 kg/m   General appearance: alert, no distress, WD/WN, Caucasian male Skin: scattered macules, but no worrisome lesions HEENT: normocephalic, conjunctiva/corneas normal, sclerae anicteric, PERRLA, EOMi, nares patent, no discharge or erythema, pharynx normal Oral cavity: MMM, tongue normal, teeth with moderate plaque Neck: supple, no lymphadenopathy, no thyromegaly, no masses, normal ROM, no bruits Chest: non tender, normal shape and expansion Heart: RRR, normal S1, S2, no murmurs Lungs: CTA bilaterally, no wheezes, rhonchi, or rales Abdomen: +bs, soft, non tender, non distended, no masses, no hepatomegaly, no splenomegaly, no bruits Back: non tender, normal ROM, no scoliosis Musculoskeletal: upper extremities non tender, no obvious deformity, normal ROM throughout, lower extremities non tender, no obvious deformity, normal ROM throughout Extremities: +varicose veins of bilat LE, no obvious edema, no cyanosis, no clubbing Pulses: 2+ symmetric, upper and lower extremities, normal cap refill Neurological: alert, oriented x 3, CN2-12 intact, strength normal upper  extremities and lower extremities, sensation normal throughout, DTRs 2+ throughout, no cerebellar signs, gait normal Psychiatric: normal affect, behavior normal, pleasant  GU: normal male external genitalia, nontender, no masses, no hernia, no lymphadenopathy Rectal: anus normal tone, prostate mildly enlarged, no nodules    Assessment and Plan :   Encounter Diagnoses  Name Primary?   Encounter for health maintenance examination in adult Yes   Elevated uric acid in blood    Vaccine counseling    Thyroid nodule    Screening for prostate cancer    Screening for heart disease    Screen for colon cancer    Obesity, unspecified classification, unspecified obesity type, unspecified whether serious comorbidity present    Impaired fasting blood sugar    Hyperlipidemia, unspecified hyperlipidemia type    Gastroesophageal reflux disease, unspecified whether esophagitis present    Needs flu shot    Need for pneumococcal vaccination    Varicose veins of both lower extremities, unspecified whether complicated    Sleep disturbance    Nocturia     This visit was a preventative care visit, also known as wellness visit or routine physical.   Topics typically include healthy lifestyle, diet, exercise, preventative care, vaccinations, sick and well care, proper use of emergency dept and after hours care, as well as other concerns.  Recommendations: Continue to return yearly for your annual wellness and preventative care visits.  This gives [place] a chance to discuss healthy lifestyle, exercise, vaccinations, review your chart record, and perform screenings where appropriate.  I recommend you see your eye doctor yearly for routine vision care.  I recommend you see your dentist yearly for routine dental care including hygiene visits twice yearly.   Vaccination recommendations were reviewed Immunization History  Administered Date(s) Administered   Influenza,inj,Quad PF,6+ Mos 03/16/2020,  06/23/2021   PFIZER Comirnaty(Gray Top)Covid-19 Tri-Sucrose Vaccine 12/13/2020   PFIZER(Purple Top)SARS-COV-2 Vaccination 09/25/2019, 10/20/2019, 06/23/2020   Pneumococcal Polysaccharide-23 06/23/2021   Td 03/16/2020   Tdap 01/05/2010    Shingles vaccine:  I recommend you have a shingles vaccine to help prevent shingles or herpes zoster outbreak.   Please call your insurer to inquire about coverage for the Shingrix vaccine given in 2 doses.   Some insurers cover this vaccine after age 22, some cover this after age 59.  If your insurer covers this, then call to schedule appointment to have this vaccine here.  Counseled on the influenza virus vaccine.  Vaccine information sheet given.  Influenza vaccine given after consent obtained.  Counseled on the pneumococcal vaccine.  Vaccine information sheet given.  Pneumococcal vaccine PPSV23 given after consent obtained.    Screening for cancer: Colon cancer screening: You are due for colon cancer screen  Please call your insurance company to check coverage for colon cancer screening.  Options may include Cologard stool test or Colonoscopy.  You should also inquire about which facility the colonoscopy could be performed, and coverage for diagnostic vs screening colonoscopy as coverage may vary.  If you have significant family history of colon cancer or blood in the stool, then you should only do the colonoscopy, not the cologard test.   We discussed PSA, prostate exam, and prostate cancer screening risks/benefits.  PSA screen today   Skin cancer screening: Check your skin regularly for new changes, growing lesions, or other lesions of concern Come in for evaluation if you have skin lesions of concern.  Lung cancer screening: If you have a greater than 20 pack year history of tobacco use, then you may qualify for lung cancer screening with a chest CT scan.   Please call your insurance company to inquire about coverage for this test.  We  currently don't have screenings for other cancers besides breast, cervical, colon, and lung cancers.  If you have a strong family history of cancer or have other cancer screening concerns, please let me know.    Bone health: Get at least 150 minutes of aerobic exercise weekly Get weight bearing exercise at least once weekly Bone density test:  A bone density test is an imaging test that uses a type of X-ray to measure the amount of calcium and other minerals in your bones. The test may be used to diagnose or screen you for a condition that causes weak or thin bones (osteoporosis), predict your risk for a broken bone (fracture), or determine how well your osteoporosis treatment is working. The bone density test is recommended for females 65 and older, or females or males <65 if certain risk factors such as thyroid disease, long term use of steroids such as for asthma or rheumatological issues, vitamin D deficiency, estrogen deficiency, family history of osteoporosis, self or family history of fragility fracture in first degree relative.    Heart health: Get at least 150 minutes of aerobic exercise weekly Limit alcohol It is  important to maintain a healthy blood pressure and healthy cholesterol numbers  Heart disease screening: Screening for heart disease includes screening for blood pressure, fasting lipids, glucose/diabetes screening, BMI height to weight ratio, reviewed of smoking status, physical activity, and diet.    Goals include blood pressure 120/80 or less, maintaining a healthy lipid/cholesterol profile, preventing diabetes or keeping diabetes numbers under good control, not smoking or using tobacco products, exercising most days per week or at least 150 minutes per week of exercise, and eating healthy variety of fruits and vegetables, healthy oils, and avoiding unhealthy food choices like fried food, fast food, high sugar and high cholesterol foods.    Other tests may possibly  include EKG test, CT coronary calcium score, echocardiogram, exercise treadmill stress test.   Consider CT coronary calcium score test for coronary artery disease screening    Medical care options: I recommend you continue to seek care here first for routine care.  We try really hard to have available appointments Monday through Friday daytime hours for sick visits, acute visits, and physicals.  Urgent care should be used for after hours and weekends for significant issues that cannot wait till the next day.  The emergency department should be used for significant potentially life-threatening emergencies.  The emergency department is expensive, can often have long wait times for less significant concerns, so try to utilize primary care, urgent care, or telemedicine when possible to avoid unnecessary trips to the emergency department.  Virtual visits and telemedicine have been introduced since the pandemic started in 2020, and can be convenient ways to receive medical care.  We offer virtual appointments as well to assist you in a variety of options to seek medical care.    Separate significant issues discussed: Obesity Work on efforts to lose weight through healthy diet and exercise    Impaired glucose labs today, continues on metformin once daily Eat a healthy low sugar diet and exercise regularly  Hyperlipidemia fasting labs today Consider coronary CT score test  Thyroid nodule 2015, small enough not meeting criteria for biopsy Consider repeat surveillance ultrasound  GERD, history of esophageal stricture and prior endoscopy I will review your prior endoscopy record It may be worth trial of Famotidine medication to reduce long term risk of medication adverse effects  History of gout, elevated uric acid You reported that you are not currently on medication If your uric acid is elevated, you may need to get back on allopurinol to reduce risk of gout flares or reduce risk to  kidneys  Nocturia/sleep disturbance Limit liquids within 1-2 hours of bedtime Consider Flomax medication to relax urethra/help reduce night time urination   Taher was seen today for fasting cpe. .  Diagnoses and all orders for this visit:  Encounter for health maintenance examination in adult -     Comprehensive metabolic panel -     CBC -     Lipid panel -     Hemoglobin A1c -     PSA -     Uric acid -     Urinalysis, Routine w reflex microscopic -     TSH  Elevated uric acid in blood -     Uric acid  Vaccine counseling  Thyroid nodule -     TSH  Screening for prostate cancer -     PSA  Screening for heart disease  Screen for colon cancer  Obesity, unspecified classification, unspecified obesity type, unspecified whether serious comorbidity present  Impaired fasting blood sugar -  Hemoglobin A1c  Hyperlipidemia, unspecified hyperlipidemia type -     Lipid panel  Gastroesophageal reflux disease, unspecified whether esophagitis present  Needs flu shot -     Flu Vaccine QUAD 55mo+IM (Fluarix, Fluzone & Alfiuria Quad PF)  Need for pneumococcal vaccination -     Pneumococcal polysaccharide vaccine 23-valent greater than or equal to 2yo subcutaneous/IM  Varicose veins of both lower extremities, unspecified whether complicated  Sleep disturbance  Nocturia    Follow-up pending labs, yearly for physical

## 2021-06-23 NOTE — Patient Instructions (Signed)
This visit was a preventative care visit, also known as wellness visit or routine physical.   Topics typically include healthy lifestyle, diet, exercise, preventative care, vaccinations, sick and well care, proper use of emergency dept and after hours care, as well as other concerns.     Recommendations: Continue to return yearly for your annual wellness and preventative care visits.  This gives [place] a chance to discuss healthy lifestyle, exercise, vaccinations, review your chart record, and perform screenings where appropriate.  I recommend you see your eye doctor yearly for routine vision care.  I recommend you see your dentist yearly for routine dental care including hygiene visits twice yearly.   Vaccination recommendations were reviewed Immunization History  Administered Date(s) Administered   Influenza,inj,Quad PF,6+ Mos 03/16/2020, 06/23/2021   PFIZER Comirnaty(Gray Top)Covid-19 Tri-Sucrose Vaccine 12/13/2020   PFIZER(Purple Top)SARS-COV-2 Vaccination 09/25/2019, 10/20/2019, 06/23/2020   Pneumococcal Polysaccharide-23 06/23/2021   Td 03/16/2020   Tdap 01/05/2010    Shingles vaccine:  I recommend you have a shingles vaccine to help prevent shingles or herpes zoster outbreak.   Please call your insurer to inquire about coverage for the Shingrix vaccine given in 2 doses.   Some insurers cover this vaccine after age 27, some cover this after age 38.  If your insurer covers this, then call to schedule appointment to have this vaccine here.  Counseled on the influenza virus vaccine.  Vaccine information sheet given.  Influenza vaccine given after consent obtained.  Counseled on the pneumococcal vaccine.  Vaccine information sheet given.  Pneumococcal vaccine PPSV23 given after consent obtained.    Screening for cancer: Colon cancer screening: You are due for colon cancer screen  Please call your insurance company to check coverage for colon cancer screening.  Options may include  Cologard stool test or Colonoscopy.  You should also inquire about which facility the colonoscopy could be performed, and coverage for diagnostic vs screening colonoscopy as coverage may vary.  If you have significant family history of colon cancer or blood in the stool, then you should only do the colonoscopy, not the cologard test.   We discussed PSA, prostate exam, and prostate cancer screening risks/benefits.  PSA screen today   Skin cancer screening: Check your skin regularly for new changes, growing lesions, or other lesions of concern Come in for evaluation if you have skin lesions of concern.  Lung cancer screening: If you have a greater than 20 pack year history of tobacco use, then you may qualify for lung cancer screening with a chest CT scan.   Please call your insurance company to inquire about coverage for this test.  We currently don't have screenings for other cancers besides breast, cervical, colon, and lung cancers.  If you have a strong family history of cancer or have other cancer screening concerns, please let me know.    Bone health: Get at least 150 minutes of aerobic exercise weekly Get weight bearing exercise at least once weekly Bone density test:  A bone density test is an imaging test that uses a type of X-ray to measure the amount of calcium and other minerals in your bones. The test may be used to diagnose or screen you for a condition that causes weak or thin bones (osteoporosis), predict your risk for a broken bone (fracture), or determine how well your osteoporosis treatment is working. The bone density test is recommended for females 65 and older, or females or males <65 if certain risk factors such as thyroid disease, long term  use of steroids such as for asthma or rheumatological issues, vitamin D deficiency, estrogen deficiency, family history of osteoporosis, self or family history of fragility fracture in first degree relative.    Heart health: Get at  least 150 minutes of aerobic exercise weekly Limit alcohol It is important to maintain a healthy blood pressure and healthy cholesterol numbers  Heart disease screening: Screening for heart disease includes screening for blood pressure, fasting lipids, glucose/diabetes screening, BMI height to weight ratio, reviewed of smoking status, physical activity, and diet.    Goals include blood pressure 120/80 or less, maintaining a healthy lipid/cholesterol profile, preventing diabetes or keeping diabetes numbers under good control, not smoking or using tobacco products, exercising most days per week or at least 150 minutes per week of exercise, and eating healthy variety of fruits and vegetables, healthy oils, and avoiding unhealthy food choices like fried food, fast food, high sugar and high cholesterol foods.    Other tests may possibly include EKG test, CT coronary calcium score, echocardiogram, exercise treadmill stress test.   Consider CT coronary calcium score test for coronary artery disease screening    Medical care options: I recommend you continue to seek care here first for routine care.  We try really hard to have available appointments Monday through Friday daytime hours for sick visits, acute visits, and physicals.  Urgent care should be used for after hours and weekends for significant issues that cannot wait till the next day.  The emergency department should be used for significant potentially life-threatening emergencies.  The emergency department is expensive, can often have long wait times for less significant concerns, so try to utilize primary care, urgent care, or telemedicine when possible to avoid unnecessary trips to the emergency department.  Virtual visits and telemedicine have been introduced since the pandemic started in 2020, and can be convenient ways to receive medical care.  We offer virtual appointments as well to assist you in a variety of options to seek medical  care.    Separate significant issues discussed: Obesity Work on efforts to lose weight through healthy diet and exercise    Impaired glucose labs today, continues on metformin once daily Eat a healthy low sugar diet and exercise regularly  Hyperlipidemia fasting labs today Consider coronary CT score test  Thyroid nodule 2015, small enough not meeting criteria for biopsy Consider repeat surveillance ultrasound  GERD, history of esophageal stricture and prior endoscopy I will review your prior endoscopy record It may be worth trial of Famotidine medication to reduce long term risk of medication adverse effects  History of gout, elevated uric acid You reported that you are not currently on medication If your uric acid is elevated, you may need to get back on allopurinol to reduce risk of gout flares or reduce risk to kidneys  Nocturia/sleep disturbance Limit liquids within 1-2 hours of bedtime Consider Flomax medication to relax urethra/help reduce night time urination

## 2021-06-24 ENCOUNTER — Other Ambulatory Visit: Payer: Self-pay | Admitting: Medical

## 2021-06-24 DIAGNOSIS — M109 Gout, unspecified: Secondary | ICD-10-CM

## 2021-06-24 LAB — COMPREHENSIVE METABOLIC PANEL
ALT: 29 IU/L (ref 0–44)
AST: 22 IU/L (ref 0–40)
Albumin/Globulin Ratio: 1.8 (ref 1.2–2.2)
Albumin: 4.4 g/dL (ref 3.8–4.9)
Alkaline Phosphatase: 91 IU/L (ref 44–121)
BUN/Creatinine Ratio: 18 (ref 9–20)
BUN: 13 mg/dL (ref 6–24)
Bilirubin Total: 0.7 mg/dL (ref 0.0–1.2)
CO2: 22 mmol/L (ref 20–29)
Calcium: 9.1 mg/dL (ref 8.7–10.2)
Chloride: 103 mmol/L (ref 96–106)
Creatinine, Ser: 0.73 mg/dL — ABNORMAL LOW (ref 0.76–1.27)
Globulin, Total: 2.5 g/dL (ref 1.5–4.5)
Glucose: 129 mg/dL — ABNORMAL HIGH (ref 70–99)
Potassium: 3.6 mmol/L (ref 3.5–5.2)
Sodium: 142 mmol/L (ref 134–144)
Total Protein: 6.9 g/dL (ref 6.0–8.5)
eGFR: 107 mL/min/{1.73_m2} (ref >59)

## 2021-06-24 LAB — URINALYSIS, ROUTINE W REFLEX MICROSCOPIC
Bilirubin, UA: NEGATIVE
Glucose, UA: NEGATIVE
Ketones, UA: NEGATIVE
Leukocytes,UA: NEGATIVE
Nitrite, UA: NEGATIVE
Protein,UA: NEGATIVE
RBC, UA: NEGATIVE
Specific Gravity, UA: 1.024 (ref 1.005–1.030)
Urobilinogen, Ur: 0.2 mg/dL (ref 0.2–1.0)
pH, UA: 5.5 (ref 5.0–7.5)

## 2021-06-24 LAB — URIC ACID: Uric Acid: 8 mg/dL (ref 3.8–8.4)

## 2021-06-24 LAB — CBC
Hematocrit: 44.3 % (ref 37.5–51.0)
Hemoglobin: 15 g/dL (ref 13.0–17.7)
MCH: 30.2 pg (ref 26.6–33.0)
MCHC: 33.9 g/dL (ref 31.5–35.7)
MCV: 89 fL (ref 79–97)
Platelets: 268 10*3/uL (ref 150–450)
RBC: 4.96 x10E6/uL (ref 4.14–5.80)
RDW: 12.1 % (ref 11.6–15.4)
WBC: 8 10*3/uL (ref 3.4–10.8)

## 2021-06-24 LAB — LIPID PANEL
Chol/HDL Ratio: 3.5 ratio (ref 0.0–5.0)
Cholesterol, Total: 120 mg/dL (ref 100–199)
HDL: 34 mg/dL — ABNORMAL LOW (ref >39)
LDL Chol Calc (NIH): 63 mg/dL (ref 0–99)
Triglycerides: 128 mg/dL (ref 0–149)
VLDL Cholesterol Cal: 23 mg/dL (ref 5–40)

## 2021-06-24 LAB — TSH: TSH: 1.76 u[IU]/mL (ref 0.450–4.500)

## 2021-06-24 LAB — HEMOGLOBIN A1C
Est. average glucose Bld gHb Est-mCnc: 131 mg/dL
Hgb A1c MFr Bld: 6.2 % — ABNORMAL HIGH (ref 4.8–5.6)

## 2021-06-24 LAB — PSA: Prostate Specific Ag, Serum: 0.9 ng/mL (ref 0.0–4.0)

## 2021-06-24 MED ORDER — COLCHICINE 0.6 MG PO TABS: 0.6000 mg | ORAL_TABLET | Freq: Two times a day (BID) | ORAL | 1 refills | Status: DC | PRN

## 2021-06-24 MED ORDER — OMEPRAZOLE 20 MG PO CPDR: 20.0000 mg | DELAYED_RELEASE_CAPSULE | Freq: Every day | ORAL | 1 refills | Status: DC

## 2021-06-24 MED ORDER — ROSUVASTATIN CALCIUM 10 MG PO TABS: 10.0000 mg | ORAL_TABLET | Freq: Every day | ORAL | 3 refills | Status: DC

## 2021-06-24 MED ORDER — METFORMIN HCL 850 MG PO TABS: 850.0000 mg | ORAL_TABLET | Freq: Two times a day (BID) | ORAL | 3 refills | Status: DC

## 2021-06-24 MED ORDER — ALLOPURINOL 100 MG PO TABS: 100.0000 mg | ORAL_TABLET | Freq: Every day | ORAL | 1 refills | Status: DC

## 2021-10-06 ENCOUNTER — Emergency Department (HOSPITAL_COMMUNITY): Payer: Commercial Managed Care - HMO

## 2021-10-06 ENCOUNTER — Emergency Department (HOSPITAL_COMMUNITY)
Admission: EM | Admit: 2021-10-06 | Discharge: 2021-10-06 | Disposition: A | Discharge status: Home / Self Care | Payer: Commercial Managed Care - HMO | Attending: Emergency Medicine | Admitting: Emergency Medicine

## 2021-10-06 ENCOUNTER — Encounter (HOSPITAL_COMMUNITY): Payer: Self-pay | Admitting: *Deleted

## 2021-10-06 DIAGNOSIS — N201 Calculus of ureter: Secondary | ICD-10-CM | POA: Diagnosis not present

## 2021-10-06 DIAGNOSIS — R112 Nausea with vomiting, unspecified: Secondary | ICD-10-CM | POA: Diagnosis present

## 2021-10-06 LAB — URINALYSIS, ROUTINE W REFLEX MICROSCOPIC
Bilirubin Urine: NEGATIVE
Glucose, UA: 50 mg/dL — AB
Hgb urine dipstick: NEGATIVE
Ketones, ur: 20 mg/dL — AB
Leukocytes,Ua: NEGATIVE
Nitrite: NEGATIVE
Protein, ur: NEGATIVE mg/dL
Specific Gravity, Urine: 1.014 (ref 1.005–1.030)
pH: 8 (ref 5.0–8.0)

## 2021-10-06 LAB — CBC WITH DIFFERENTIAL/PLATELET
Abs Immature Granulocytes: 0.03 10*3/uL (ref 0.00–0.07)
Basophils Absolute: 0.1 10*3/uL (ref 0.0–0.1)
Basophils Relative: 1 %
Eosinophils Absolute: 0.3 10*3/uL (ref 0.0–0.5)
Eosinophils Relative: 3 %
HCT: 42.3 % (ref 39.0–52.0)
Hemoglobin: 14.6 g/dL (ref 13.0–17.0)
Immature Granulocytes: 0 %
Lymphocytes Relative: 21 %
Lymphs Abs: 2 10*3/uL (ref 0.7–4.0)
MCH: 30.2 pg (ref 26.0–34.0)
MCHC: 34.5 g/dL (ref 30.0–36.0)
MCV: 87.6 fL (ref 80.0–100.0)
Monocytes Absolute: 0.6 10*3/uL (ref 0.1–1.0)
Monocytes Relative: 7 %
Neutro Abs: 6.4 10*3/uL (ref 1.7–7.7)
Neutrophils Relative %: 68 %
Platelets: 278 10*3/uL (ref 150–400)
RBC: 4.83 MIL/uL (ref 4.22–5.81)
RDW: 12.7 % (ref 11.5–15.5)
WBC: 9.3 10*3/uL (ref 4.0–10.5)
nRBC: 0 % (ref 0.0–0.2)

## 2021-10-06 LAB — COMPREHENSIVE METABOLIC PANEL
ALT: 43 U/L (ref 0–44)
AST: 26 U/L (ref 15–41)
Albumin: 3.8 g/dL (ref 3.5–5.0)
Alkaline Phosphatase: 73 U/L (ref 38–126)
Anion gap: 11 (ref 5–15)
BUN: 16 mg/dL (ref 6–20)
CO2: 22 mmol/L (ref 22–32)
Calcium: 9.2 mg/dL (ref 8.9–10.3)
Chloride: 106 mmol/L (ref 98–111)
Creatinine, Ser: 0.97 mg/dL (ref 0.61–1.24)
GFR, Estimated: >60 mL/min (ref >60)
Glucose, Bld: 185 mg/dL — ABNORMAL HIGH (ref 70–99)
Potassium: 3.6 mmol/L (ref 3.5–5.1)
Sodium: 139 mmol/L (ref 135–145)
Total Bilirubin: 0.7 mg/dL (ref 0.3–1.2)
Total Protein: 7.1 g/dL (ref 6.5–8.1)

## 2021-10-06 LAB — LIPASE, BLOOD: Lipase: 47 U/L (ref 11–51)

## 2021-10-06 MED ORDER — KETOROLAC TROMETHAMINE 15 MG/ML IJ SOLN
15.0000 mg | Freq: Once | INTRAMUSCULAR | Status: AC
Start: 2021-10-06 — End: 2021-10-06
  Administered 2021-10-06: 15 mg via INTRAVENOUS
  Filled 2021-10-06: qty 1

## 2021-10-06 MED ORDER — MORPHINE SULFATE (PF) 2 MG/ML IV SOLN
2.0000 mg | Freq: Once | INTRAVENOUS | Status: AC
  Administered 2021-10-06: 2 mg via INTRAVENOUS
  Filled 2021-10-06: qty 1

## 2021-10-06 MED ORDER — OXYCODONE-ACETAMINOPHEN 5-325 MG PO TABS: 1.0000 | ORAL_TABLET | Freq: Four times a day (QID) | ORAL | 0 refills | Status: DC | PRN

## 2021-10-06 MED ORDER — ONDANSETRON 4 MG PO TBDP
4.0000 mg | ORAL_TABLET | Freq: Once | ORAL | Status: AC
Start: 2021-10-06 — End: 2021-10-06
  Administered 2021-10-06: 4 mg via ORAL
  Filled 2021-10-06: qty 1

## 2021-10-06 MED ORDER — ONDANSETRON 4 MG PO TBDP: 4.0000 mg | ORAL_TABLET | Freq: Three times a day (TID) | ORAL | 0 refills | Status: DC | PRN

## 2021-10-06 MED ORDER — TAMSULOSIN HCL 0.4 MG PO CAPS
0.4000 mg | ORAL_CAPSULE | ORAL | Status: AC
  Administered 2021-10-06: 0.4 mg via ORAL
  Filled 2021-10-06: qty 1

## 2021-10-06 MED ORDER — MORPHINE SULFATE (PF) 4 MG/ML IV SOLN
4.0000 mg | Freq: Once | INTRAVENOUS | Status: AC
Start: 2021-10-06 — End: 2021-10-06
  Administered 2021-10-06: 4 mg via INTRAVENOUS
  Filled 2021-10-06: qty 1

## 2021-10-06 MED ORDER — OXYCODONE-ACETAMINOPHEN 5-325 MG PO TABS
2.0000 | ORAL_TABLET | Freq: Once | ORAL | Status: AC
  Administered 2021-10-06: 2 via ORAL
  Filled 2021-10-06: qty 2

## 2021-10-06 MED ORDER — TAMSULOSIN HCL 0.4 MG PO CAPS
0.4000 mg | ORAL_CAPSULE | Freq: Every day | ORAL | 0 refills | Status: AC
Start: 2021-10-06 — End: 2021-10-27

## 2021-10-06 NOTE — ED Notes (Signed)
Pt seen ambulating to and from the bathroom, steady gait, NAD. ?

## 2021-10-06 NOTE — Discharge Instructions (Addendum)
You are found to have a 5-mm kidney stone please call [facility] to make a follow-up appointment.  Please strain all of your urine.  Please use Tylenol and ibuprofen as discussed below.  Hydrate well, small and limit juice to your water.  Take tamsulosin as prescribed and Percocet for breakthrough pain. ? ?Please use Tylenol or ibuprofen for pain.  You may use 600 mg ibuprofen every 6 hours or 1000 mg of Tylenol every 6 hours.  You may choose to alternate between the 2.  This would be most effective.  Not to exceed 4 g of Tylenol within 24 hours.  Not to exceed 3200 mg ibuprofen 24 hours.  ? ?Notably there is a small amount of Tylenol--specifically 325 mg--in each dose of Percocet.  If you do take a dose of Percocet please take a 500 mg instead of the 1000 mg dose of Tylenol. ?

## 2021-10-06 NOTE — ED Provider Notes (Signed)
?[facility] EMERGENCY DEPARTMENT ?Provider Note ? ? ?CSN: [id number] ?Arrival date & time: 10/06/21  0029 ? ?  ? ?History ? ?Chief Complaint  ?Patient presents with  ? Back Pain  ? ? ?Nathaniel Hahn is a 56 y.o. male. ? ? ?Back Pain ?Patient is a 56 year old male presents to the emergency room today with complaints of back pain seems to be left-sided radiates to his left groin he endorses some radiation to his left testicle as well.  Denies any fevers burning urination frequency urgency or hematuria.  Has a history of 1 kidney stone in the past but states it did not feel to severe. ? ?Endorses some nausea and vomiting.  Nonbloody nonbilious.  He states that the pain came on suddenly that began yesterday evening.  He came to the ER and unfortunately had to wait for between 7 and 8 hours before being placed in room. ? ? ?  ? ?Home Medications ?Prior to Admission medications   ?Medication Sig Start Date End Date Taking? Authorizing Provider  ?acetaminophen (TYLENOL) 500 MG tablet Take 1,000 mg by mouth every 6 (six) hours as needed.   Yes [provider]  ?allopurinol (ZYLOPRIM) 100 MG tablet Take 1 tablet (100 mg total) by mouth daily. 06/24/21 06/24/22 Yes Tysinger, Kermit Balo, PA-C  ?colchicine 0.6 MG tablet Take 1 tablet (0.6 mg total) by mouth 2 (two) times daily as needed. 06/24/21  Yes Tysinger, Kermit Balo, PA-C  ?ibuprofen (ADVIL) 200 MG tablet Take 400 mg by mouth every 6 (six) hours as needed for mild pain.   Yes [provider]  ?metFORMIN (GLUCOPHAGE) 850 MG tablet Take 1 tablet (850 mg total) by mouth 2 (two) times daily with a meal. 06/24/21 06/24/22 Yes Tysinger, Kermit Balo, PA-C  ?Multiple Vitamins-Minerals (MULTIVITAMIN ADULT) CHEW Chew 1 each by mouth daily.   Yes [provider]  ?omeprazole (PRILOSEC) 20 MG capsule Take 1 capsule (20 mg total) by mouth daily. 06/24/21  Yes Tysinger, Kermit Balo, PA-C  ?rosuvastatin (CRESTOR) 10 MG tablet Take 1 tablet (10 mg total) by  mouth daily. 06/24/21  Yes Tysinger, Kermit Balo, PA-C  ?   ? ?Allergies    ?Armoracia rusticana ext (horseradish), Onion, and Penicillins   ? ?Review of Systems   ?Review of Systems  ?Musculoskeletal:  Positive for back pain.  ? ?Physical Exam ?Updated Vital Signs ?BP (!) 151/77 (BP Location: Left Arm)   Pulse 85   Temp 97.6 ?F (36.4 ?C)   Resp 17   Ht 6\' 2"  (1.88 m)   Wt (!) 161.9 kg   SpO2 96%   BMI 45.83 kg/m?  ?Physical Exam ?Vitals and nursing note reviewed.  ?Constitutional:   ?   General: He is in acute distress.  ?   Appearance: He is obese.  ?HENT:  ?   Head: Normocephalic and atraumatic.  ?   Nose: Nose normal.  ?   Mouth/Throat:  ?   Mouth: Mucous membranes are moist.  ?Eyes:  ?   General: No scleral icterus. ?Cardiovascular:  ?   Rate and Rhythm: Normal rate and regular rhythm.  ?   Pulses: Normal pulses.  ?   Heart sounds: Normal heart sounds.  ?Pulmonary:  ?   Effort: Pulmonary effort is normal. No respiratory distress.  ?   Breath sounds: No wheezing.  ?Abdominal:  ?   Palpations: Abdomen is soft.  ?   Tenderness: There is abdominal tenderness.  ?   Comments: Suprapubic tenderness.  No guarding or rebound.  ?Genitourinary: ?   Comments: Testicular exam with normal cremasteric reflex.  Testicles nontender ?Musculoskeletal:  ?   Cervical back: Normal range of motion.  ?   Right lower leg: No edema.  ?   Left lower leg: No edema.  ?Skin: ?   General: Skin is warm and dry.  ?   Capillary Refill: Capillary refill takes less than 2 seconds.  ?Neurological:  ?   Mental Status: He is alert. Mental status is at baseline.  ?Psychiatric:     ?   Mood and Affect: Mood normal.     ?   Behavior: Behavior normal.  ? ? ?ED Results / Procedures / Treatments   ?Labs ?(all labs ordered are listed, but only abnormal results are displayed) ?Labs Reviewed  ?COMPREHENSIVE METABOLIC PANEL - Abnormal; Notable for the following components:  ?    Result Value  ? Glucose, Bld 185 (*)   ? All other components within normal  limits  ?URINALYSIS, ROUTINE W REFLEX MICROSCOPIC - Abnormal; Notable for the following components:  ? Glucose, UA 50 (*)   ? Ketones, ur 20 (*)   ? All other components within normal limits  ?CBC WITH DIFFERENTIAL/PLATELET  ?LIPASE, BLOOD  ? ? ?EKG ?None ? ?Radiology ?CT Renal Stone Study ? ?Result Date: 10/06/2021 ?CLINICAL DATA:  Flank pain, kidney stone suspected EXAM: CT ABDOMEN AND PELVIS WITHOUT CONTRAST TECHNIQUE: Multidetector CT imaging of the abdomen and pelvis was performed following the standard protocol without IV contrast. RADIATION DOSE REDUCTION: This exam was performed according to the departmental dose-optimization program which includes automated exposure control, adjustment of the mA and/or kV according to patient size and/or use of iterative reconstruction technique. COMPARISON:  None. FINDINGS: Lower chest: Lung bases are clear. Hepatobiliary: Cholelithiasis. No inflammatory changes in the gallbladder. Liver is unremarkable. Pancreas: Unremarkable. No pancreatic ductal dilatation or surrounding inflammatory changes. Spleen: Normal in size without focal abnormality. Adrenals/Urinary Tract: No adrenal gland nodules. Left hydronephrosis and hydroureter with a 5 mm stone in the mid left ureter past the ureteropelvic junction at the level of L4-5. Distal left ureter is decompressed. Right kidney, right ureter, and the bladder are normal. Stomach/Bowel: Stomach, small bowel, and colon are not abnormally distended. No wall thickening or inflammatory changes. Colonic diverticulosis without evidence of acute diverticulitis. Appendix is not identified. Vascular/Lymphatic: Aortic atherosclerosis. No enlarged abdominal or pelvic lymph nodes. Reproductive: Prostate is unremarkable. Other: No abdominal wall hernia or abnormality. No abdominopelvic ascites. Musculoskeletal: No acute or significant osseous findings. IMPRESSION: 1. 5 mm stone in the mid left ureter with moderate proximal obstruction. 2.  Cholelithiasis without evidence of acute cholecystitis. 3. Mild aortic atherosclerosis. Electronically Signed   By: Burman Nieves M.D.   On: 10/06/2021 01:53   ? ?Procedures ?Procedures  ? ? ?Medications Ordered in ED ?Medications  ?morphine (PF) 2 MG/ML injection 2 mg (has no administration in time range)  ?oxyCODONE-acetaminophen (PERCOCET/ROXICET) 5-325 MG per tablet 2 tablet (2 tablets Oral Given 10/06/21 0059)  ?ondansetron (ZOFRAN-ODT) disintegrating tablet 4 mg (4 mg Oral Given 10/06/21 0058)  ?morphine (PF) 4 MG/ML injection 4 mg (4 mg Intravenous Given 10/06/21 0814)  ?ketorolac (TORADOL) 15 MG/ML injection 15 mg (15 mg Intravenous Given 10/06/21 0814)  ?tamsulosin (FLOMAX) capsule 0.4 mg (0.4 mg Oral Given 10/06/21 0815)  ? ? ?ED Course/ Medical Decision Making/ A&P ?  ?                        ?  Medical Decision Making ?Risk ?Prescription drug management. ? ? ?This patient presents to the ED for concern of back pain, this involves a number of treatment options, and is a complaint that carries with it a high risk of complications and morbidity.  The differential diagnosis includes The emergent differential diagnosis for back pain includes but is not limited to fracture, muscle strain, cauda equina, spinal stenosis. DDD, ankylosing spondylitis, acute ligamentous injury, disk herniation, spondylolisthesis, Epidural compression syndrome, metastatic cancer, transverse myelitis, vertebral osteomyelitis, diskitis, kidney stone, pyelonephritis, AAA, Perforated ulcer, Retrocecal appendicitis, pancreatitis, bowel obstruction, retroperitoneal hemorrhage or mass, meningitis.  ? ? ?Co morbidities: ?Discussed in HPI ? ? ?Brief History: ? ?Patient is a 56 year old male presents to the emergency room today with complaints of back pain seems to be left-sided radiates to his left groin he endorses some radiation to his left testicle as well.  Denies any fevers burning urination frequency urgency or hematuria.  Has a history of 1  kidney stone in the past but states it did not feel to severe. ? ?Endorses some nausea and vomiting.  Nonbloody nonbilious.  He states that the pain came on suddenly that began yesterday evening.  He came to the ER and unf

## 2021-10-06 NOTE — ED Triage Notes (Incomplete)
Pt c/o back  pain that started approx one hour nausea and vomiting ?

## 2021-10-06 NOTE — ED Notes (Signed)
Patient put in recliner for comfort ?

## 2021-10-06 NOTE — ED Provider Triage Note (Signed)
Emergency Medicine Provider Triage Evaluation Note ? ?Nathaniel Hahn , a 56 y.o. male  was evaluated in triage.  Pt complains of back pain. ? ?Review of Systems  ?Positive: Back pain, radiates to L groin, nausea, vomiting ?Negative: Fever, dysuria, hematuria ? ?Physical Exam  ?There were no vitals taken for this visit. ?Gen:   Awake, appears uncomfortable ?Resp:  Normal effort  ?MSK:   Moves extremities without difficulty  ?Other:  TTP Left lower lumbar, LLQ tenderness ? ?Medical Decision Making  ?Medically screening exam initiated at 12:44 AM.  Appropriate orders placed.  Nathaniel Hahn was informed that the remainder of the evaluation will be completed by another provider, this initial triage assessment does not replace that evaluation, and the importance of remaining in the ED until their evaluation is complete. ? ?Acute onset of left lower back pain when he turns in bed this evening.  Pain reproducible on exam, but does radiates to LLQ.  Remote hx of kidney stone ?  ?Nathaniel Helper, PA-C ?10/06/21 [phone] ? ?

## 2021-10-10 ENCOUNTER — Encounter (HOSPITAL_COMMUNITY): Payer: Self-pay | Admitting: Urology

## 2021-10-10 ENCOUNTER — Other Ambulatory Visit: Payer: Self-pay | Admitting: Urology

## 2021-10-10 ENCOUNTER — Other Ambulatory Visit: Payer: Self-pay

## 2021-10-10 NOTE — Progress Notes (Addendum)
COVID Vaccine Completed: Yes x2 09-25-19 10-20-19 ?Date COVID Vaccine completed:  Yes x2 06-23-20 12-13-20 ?Has received booster: ?COVID vaccine manufacturer: Pfizer     ? ?Date of COVID positive in last 90 days:  No ? ?PCP - Crosby Oyster, PA-C ?Cardiologist - N/A ? ?Chest x-ray - N/A ?EKG - N/A ?Stress Test - N/A ?ECHO - N/A ?Cardiac Cath - N/A ?Pacemaker/ICD device last checked: ?Spinal Cord Stimulator: ? ?Bowel Prep - N/A ? ?Sleep Study - N/A ?CPAP -  ? ?Prediabetic  ?Fasting Blood Sugar - N/A ?Checks Blood Sugar - does not check  ? ?Blood Thinner Instructions:  N/A ?Aspirin Instructions: ?Last Dose: ? ?Activity level:   Can go up a flight of stairs and perform activities of daily living without stopping and without symptoms of chest pain or shortness of breath.   ? ?Anesthesia review: N/A ? ?Patient denies shortness of breath, fever, cough and chest pain at PAT appointment (completed over the phone) ? ?Patient verbalized understanding of instructions that were given to them at the PAT appointment. Patient was also instructed that they will need to review over the PAT instructions again at home before surgery.  ?

## 2021-10-11 ENCOUNTER — Encounter (HOSPITAL_COMMUNITY): Payer: Self-pay | Admitting: Anesthesiology

## 2021-10-12 ENCOUNTER — Ambulatory Visit (HOSPITAL_COMMUNITY): Admission: RE | Admit: 2021-10-12 | Payer: Managed Care, Other (non HMO) | Source: Home / Self Care | Admitting: Urology

## 2021-10-12 DIAGNOSIS — R7303 Prediabetes: Secondary | ICD-10-CM

## 2021-10-12 HISTORY — DX: Gastro-esophageal reflux disease without esophagitis: K21.9

## 2021-10-12 HISTORY — DX: Personal history of urinary calculi: Z87.442

## 2021-10-12 HISTORY — DX: Prediabetes: R73.03

## 2021-10-12 SURGERY — CYSTOSCOPY/URETEROSCOPY/HOLMIUM LASER/STENT PLACEMENT
Anesthesia: General | Laterality: Left

## 2021-10-12 NOTE — Progress Notes (Signed)
Pt showed up in admitting with his stone in a cup- states he passed it and is pain free.  Dr Lovena Neighbours advised. Surgery cancelled and pt to drop his stone off at the office. ?

## 2021-12-21 ENCOUNTER — Other Ambulatory Visit: Payer: Self-pay | Admitting: Medical

## 2022-01-23 LAB — HM DIABETES EYE EXAM

## 2022-01-27 ENCOUNTER — Encounter: Payer: Self-pay | Admitting: Medical

## 2022-03-08 ENCOUNTER — Encounter: Payer: Self-pay | Admitting: Internal Medicine

## 2022-04-11 ENCOUNTER — Encounter: Payer: Self-pay | Admitting: Internal Medicine

## 2022-06-27 ENCOUNTER — Encounter: Payer: 59 | Admitting: Medical

## 2022-07-03 DIAGNOSIS — E119 Type 2 diabetes mellitus without complications: Secondary | ICD-10-CM

## 2022-07-03 HISTORY — DX: Type 2 diabetes mellitus without complications: E11.9

## 2022-07-18 ENCOUNTER — Other Ambulatory Visit: Payer: Self-pay | Admitting: Medical

## 2022-07-26 ENCOUNTER — Ambulatory Visit: Payer: BLUE CROSS/BLUE SHIELD | Admitting: Medical

## 2022-07-26 VITALS — BP 124/80 | HR 89 | Wt 367.4 lb

## 2022-07-26 DIAGNOSIS — E785 Hyperlipidemia, unspecified: Secondary | ICD-10-CM

## 2022-07-26 DIAGNOSIS — Z7185 Encounter for immunization safety counseling: Secondary | ICD-10-CM

## 2022-07-26 DIAGNOSIS — R7301 Impaired fasting glucose: Secondary | ICD-10-CM | POA: Diagnosis not present

## 2022-07-26 DIAGNOSIS — K219 Gastro-esophageal reflux disease without esophagitis: Secondary | ICD-10-CM

## 2022-07-26 DIAGNOSIS — Z125 Encounter for screening for malignant neoplasm of prostate: Secondary | ICD-10-CM | POA: Diagnosis not present

## 2022-07-26 DIAGNOSIS — E041 Nontoxic single thyroid nodule: Secondary | ICD-10-CM

## 2022-07-26 DIAGNOSIS — E790 Hyperuricemia without signs of inflammatory arthritis and tophaceous disease: Secondary | ICD-10-CM

## 2022-07-26 DIAGNOSIS — I8393 Asymptomatic varicose veins of bilateral lower extremities: Secondary | ICD-10-CM

## 2022-07-26 MED ORDER — ROSUVASTATIN CALCIUM 10 MG PO TABS: 10.0000 mg | ORAL_TABLET | Freq: Every day | ORAL | 0 refills | Status: DC

## 2022-07-26 MED ORDER — METFORMIN HCL 850 MG PO TABS: 850.0000 mg | ORAL_TABLET | Freq: Every day | ORAL | 0 refills | Status: DC

## 2022-07-26 NOTE — Progress Notes (Signed)
Subjective:  Nathaniel Hahn is a 57 y.o. male who presents for Chief Complaint  Patient presents with   MED CHECK    Med check- no concerns-      Here for follow up.  Was scheduled for physical, but found out when he got here today that insurance is not in network here so doesn't want to do full labs and physical today.  No recent concerns.  Is trying to maintain healthy diet.  Compliant with medications  Impaired glucose - Taking Metformin 850mg  once daily.    GERD - Taking omeprazole prn.    Hyperlipidemia - Taking Crestor 10mg  daily.  Had kidney stone in recent months.  Hasn't yet done colon cancer screening.  No other aggravating or relieving factors.    No other c/o.  Past Medical History:  Diagnosis Date   Allergy    Distal radius fracture, right 08/2015   Fraser type injury   GERD (gastroesophageal reflux disease)    Hearing loss    history of working around loud machinery   History of kidney stones    History of sinusitis    yearly   Leg pain, diffuse    Meningitis    6 months old   Plantar fasciitis    Pre-diabetes    Current Outpatient Medications on File Prior to Visit  Medication Sig Dispense Refill   Multiple Vitamins-Minerals (MULTIVITAMIN ADULT) CHEW Chew 1 each by mouth daily.     omeprazole (PRILOSEC) 20 MG capsule TAKE 1 CAPSULE BY MOUTH ONCE DAILY. 90 capsule 1   No current facility-administered medications on file prior to visit.     The following portions of the patient's history were reviewed and updated as appropriate: allergies, current medications, past family history, past medical history, past social history, past surgical history and problem list.  ROS Otherwise as in subjective above    Objective: BP 124/80   Pulse 89   Wt (!) 367 lb 6.4 oz (166.7 kg)   BMI 45.92 kg/m   Wt Readings from Last 3 Encounters:  07/26/22 (!) 367 lb 6.4 oz (166.7 kg)  10/06/21 (!) 356 lb 14.8 oz (161.9 kg)  06/23/21 (!) 357 lb (161.9 kg)   General  appearance: alert, no distress, well developed, well nourished Neck: supple, no lymphadenopathy, no thyromegaly, no masses Heart: RRR, normal S1, S2, no murmurs Lungs: CTA bilaterally, no wheezes, rhonchi, or rales Pulses: 2+ radial pulses, 2+ pedal pulses, normal cap refill Ext: no edema     Assessment: Encounter Diagnoses  Name Primary?   Impaired fasting blood sugar Yes   Hyperlipidemia, unspecified hyperlipidemia type    Vaccine counseling    Screening for prostate cancer    Varicose veins of both lower extremities, unspecified whether complicated    Gastroesophageal reflux disease, unspecified whether esophagitis present    Thyroid nodule    Elevated uric acid in blood      Plan: Today unfortunately he had a insurance issue where his insurance may not be covered here.  He is going to call and check with insurance to straighten this out.  He declined labs today.  He is due for full labs and physical.  I refilled his medicine so he does not run out.  Continue walking regularly.  He is getting about 11,000 steps a day.  Continue trying to eat healthy.  Nathaniel Hahn was seen today for med check.  Diagnoses and all orders for this visit:  Impaired fasting blood sugar  Hyperlipidemia, unspecified hyperlipidemia type  Vaccine counseling  Screening for prostate cancer  Varicose veins of both lower extremities, unspecified whether complicated  Gastroesophageal reflux disease, unspecified whether esophagitis present  Thyroid nodule  Elevated uric acid in blood  Other orders -     metFORMIN (GLUCOPHAGE) 850 MG tablet; Take 1 tablet (850 mg total) by mouth daily. -     rosuvastatin (CRESTOR) 10 MG tablet; Take 1 tablet (10 mg total) by mouth daily.   Follow up: once he gets insurance straightened out

## 2022-10-25 ENCOUNTER — Other Ambulatory Visit: Payer: Self-pay | Admitting: Medical

## 2023-01-04 ENCOUNTER — Other Ambulatory Visit: Payer: Self-pay | Admitting: Medical

## 2023-03-18 ENCOUNTER — Other Ambulatory Visit: Payer: Self-pay | Admitting: Medical

## 2023-03-19 ENCOUNTER — Encounter: Payer: Self-pay | Admitting: Internal Medicine

## 2023-04-14 ENCOUNTER — Other Ambulatory Visit: Payer: Self-pay | Admitting: Medical

## 2023-04-16 NOTE — Telephone Encounter (Signed)
Left message for pt to call back to schedule a visit. Once scheduled we can refill for 30 days

## 2023-04-17 ENCOUNTER — Other Ambulatory Visit: Payer: Self-pay | Admitting: Medical

## 2023-04-26 ENCOUNTER — Encounter: Payer: Self-pay | Admitting: Medical

## 2023-04-26 ENCOUNTER — Ambulatory Visit: Payer: 59 | Admitting: Medical

## 2023-04-26 VITALS — BP 110/68 | HR 85 | Wt 359.0 lb

## 2023-04-26 DIAGNOSIS — E041 Nontoxic single thyroid nodule: Secondary | ICD-10-CM | POA: Diagnosis not present

## 2023-04-26 DIAGNOSIS — Z0000 Encounter for general adult medical examination without abnormal findings: Secondary | ICD-10-CM

## 2023-04-26 DIAGNOSIS — Z125 Encounter for screening for malignant neoplasm of prostate: Secondary | ICD-10-CM

## 2023-04-26 DIAGNOSIS — Z23 Encounter for immunization: Secondary | ICD-10-CM

## 2023-04-26 DIAGNOSIS — Z7185 Encounter for immunization safety counseling: Secondary | ICD-10-CM

## 2023-04-26 DIAGNOSIS — E785 Hyperlipidemia, unspecified: Secondary | ICD-10-CM | POA: Diagnosis not present

## 2023-04-26 DIAGNOSIS — R7301 Impaired fasting glucose: Secondary | ICD-10-CM | POA: Diagnosis not present

## 2023-04-26 DIAGNOSIS — Z1211 Encounter for screening for malignant neoplasm of colon: Secondary | ICD-10-CM

## 2023-04-26 DIAGNOSIS — I8393 Asymptomatic varicose veins of bilateral lower extremities: Secondary | ICD-10-CM

## 2023-04-26 DIAGNOSIS — K219 Gastro-esophageal reflux disease without esophagitis: Secondary | ICD-10-CM | POA: Diagnosis not present

## 2023-04-26 DIAGNOSIS — Z136 Encounter for screening for cardiovascular disorders: Secondary | ICD-10-CM

## 2023-04-26 DIAGNOSIS — E790 Hyperuricemia without signs of inflammatory arthritis and tophaceous disease: Secondary | ICD-10-CM

## 2023-04-26 LAB — POCT URINALYSIS DIP (PROADVANTAGE DEVICE)
Blood, UA: NEGATIVE
Glucose, UA: NEGATIVE mg/dL
Nitrite, UA: NEGATIVE
Specific Gravity, Urine: 1.015
Urobilinogen, Ur: 4
pH, UA: 6 (ref 5.0–8.0)

## 2023-04-26 NOTE — Patient Instructions (Signed)
This visit was a preventative care visit, also known as wellness visit or routine physical.   Topics typically include healthy lifestyle, diet, exercise, preventative care, vaccinations, sick and well care, proper use of emergency dept and after hours care, as well as other concerns.     Separate significant issues discussed: Hyperlipidemia - on statin, updated labs today  Prediabetes - on metformin, labs today  Obesity - continue efforts to lose weight through health diet and exercise  GERD - on omeprazole every few days    General Recommendations: Continue to return yearly for your annual wellness and preventative care visits.  This gives [place] a chance to discuss healthy lifestyle, exercise, vaccinations, review your chart record, and perform screenings where appropriate.  I recommend you see your eye doctor yearly for routine vision care.  I recommend you see your dentist yearly for routine dental care including hygiene visits twice yearly.   Vaccination  Immunization History  Administered Date(s) Administered   Influenza,inj,Quad PF,6+ Mos 03/16/2020, 06/23/2021   PFIZER Comirnaty(Gray Top)Covid-19 Tri-Sucrose Vaccine 12/13/2020   PFIZER(Purple Top)SARS-COV-2 Vaccination 09/25/2019, 10/20/2019, 06/23/2020   Pneumococcal Polysaccharide-23 06/23/2021   Td 03/16/2020   Tdap 01/05/2010    Vaccine recommendations: Counseled on the influenza virus vaccine.  Vaccine information sheet given.  Influenza vaccine given after consent obtained.  Counseled on the Covid virus vaccine.  Vaccine information sheet given.  Covid vaccine given after consent obtained.  Consider Shingrix vaccine as well.    Screening for cancer: Colon cancer screening: We will refer you for screening colonoscopy  Prostate Cancer screening: The recommended prostate cancer screening test is a blood test called the prostate-specific antigen (PSA) test. PSA is a protein that is made in the prostate. As you  age, your prostate naturally produces more PSA. Abnormally high PSA levels may be caused by: Prostate cancer. An enlarged prostate that is not caused by cancer (benign prostatic hyperplasia, or BPH). This condition is very common in older men. A prostate gland infection (prostatitis) or urinary tract infection. Certain medicines such as male hormones (like testosterone) or other medicines that raise testosterone levels. A rectal exam may be done as part of prostate cancer screening to help provide information about the size of your prostate gland. When a rectal exam is performed, it should be done after the PSA level is drawn to avoid any effect on the results.   Skin cancer screening: Check your skin regularly for new changes, growing lesions, or other lesions of concern Come in for evaluation if you have skin lesions of concern.   Lung cancer screening: If you have a greater than 20 pack year history of tobacco use, then you may qualify for lung cancer screening with a chest CT scan.   Please call your insurance company to inquire about coverage for this test.   Pancreatic cancer:  no current screening test is available or routinely recommended. (risk factors: smoking, overweight or obese, diabetes, chronic pancreatitis, work exposure - dry cleaning, metal working, 57yo>, M>F, Tree surgeon, family hx/o, hereditary breast, ovarian, melanoma, lynch, peutz-jeghers).  Symptoms: jaundice, dark urine, light color or greasy stools, itchy skin, belly or back pain, weight loss, poor appetite, nausea, vomiting, liver enlargement, DVT/blood clots.   We currently don't have screenings for other cancers besides breast, cervical, colon, and lung cancers.  If you have a strong family history of cancer or have other cancer screening concerns, please let me know.  Genetic testing referral is an option for individuals with high cancer  risk in the family.  There are some other cancer screenings in development  currently.   Bone health: Get at least 150 minutes of aerobic exercise weekly Get weight bearing exercise at least once weekly Bone density test:  A bone density test is an imaging test that uses a type of X-ray to measure the amount of calcium and other minerals in your bones. The test may be used to diagnose or screen you for a condition that causes weak or thin bones (osteoporosis), predict your risk for a broken bone (fracture), or determine how well your osteoporosis treatment is working. The bone density test is recommended for females 65 and older, or females or males <65 if certain risk factors such as thyroid disease, long term use of steroids such as for asthma or rheumatological issues, vitamin D deficiency, estrogen deficiency, family history of osteoporosis, self or family history of fragility fracture in first degree relative.    Heart health: Get at least 150 minutes of aerobic exercise weekly Limit alcohol It is important to maintain a healthy blood pressure and healthy cholesterol numbers  Heart disease screening: Screening for heart disease includes screening for blood pressure, fasting lipids, glucose/diabetes screening, BMI height to weight ratio, reviewed of smoking status, physical activity, and diet.    Goals include blood pressure 120/80 or less, maintaining a healthy lipid/cholesterol profile, preventing diabetes or keeping diabetes numbers under good control, not smoking or using tobacco products, exercising most days per week or at least 150 minutes per week of exercise, and eating healthy variety of fruits and vegetables, healthy oils, and avoiding unhealthy food choices like fried food, fast food, high sugar and high cholesterol foods.    Other tests may possibly include EKG test, CT coronary calcium score, echocardiogram, exercise treadmill stress test.     Consider CT coronary heart test, $95 cash pay   Vascular disease screening: For higher risk  individuals including smokers, diabetics, patients with known heart disease or high blood pressure, kidney disease, and others, screening for vascular disease or atherosclerosis of the arteries is available.  Examples may include carotid ultrasound, abdominal aortic ultrasound, ABI blood flow screening in the legs, thoracic aorta screening.  Medical care options: I recommend you continue to seek care here first for routine care.  We try really hard to have available appointments Monday through Friday daytime hours for sick visits, acute visits, and physicals.  Urgent care should be used for after hours and weekends for significant issues that cannot wait till the next day.  The emergency department should be used for significant potentially life-threatening emergencies.  The emergency department is expensive, can often have long wait times for less significant concerns, so try to utilize primary care, urgent care, or telemedicine when possible to avoid unnecessary trips to the emergency department.  Virtual visits and telemedicine have been introduced since the pandemic started in 2020, and can be convenient ways to receive medical care.  We offer virtual appointments as well to assist you in a variety of options to seek medical care.   Legal  Take the time to do a last will and testament, Advanced Directives including Health Care Power of Attorney and Living Will documents.  Don't leave your family with burdens that can be handled ahead of time.   Advanced Directives: I recommend you consider completing a Health Care Power of Attorney and Living Will.   These documents respect your wishes and help alleviate burdens on your loved ones if you were to  become terminally ill or be in a position to need those documents enforced.    You can complete Advanced Directives yourself, have them notarized, then have copies made for our office, for you and for anybody you feel should have them in safe keeping.  Or,  you can have an attorney prepare these documents.   If you haven't updated your Last Will and Testament in a while, it may be worthwhile having an attorney prepare these documents together and save on some costs.       Spiritual and Emotional Health Keeping a healthy spiritual life can help you better manage your physical health. Your spiritual life can help you to cope with any issues that may arise with your physical health.  Balance can keep [place] healthy and help [place] to recover.  If you are struggling with your spiritual health there are questions that you may want to ask yourself:  What makes me feel most complete? When do I feel most connected to the rest of the world? Where do I find the most inner strength? What am I doing when I feel whole?  Helpful tips: Being in nature. Some people feel very connected and at peace when they are walking outdoors or are outside. Helping others. Some feel the largest sense of wellbeing when they are of service to others. Being of service can take on many forms. It can be doing volunteer work, being kind to strangers, or offering a hand to a friend in need. Gratitude. Some people find they feel the most connected when they remain grateful. They may make lists of all the things they are grateful for or say a thank you out loud for all they have.    Emotional Health Are you in tune with your emotional health?  Check out this link: http://www.marquez-love.com/    Financial Health Make sure you use a budget for your personal finances Make sure you are insured against risks (health insurance, life insurance, auto insurance, etc) Save more, spend less Set financial goals If you need help in this area, good resources include counseling through [employer] or other community resources, have a meeting with a Social research officer, government, and a good resource is the [employer]

## 2023-04-26 NOTE — Progress Notes (Signed)
Subjective:   HPI  Nathaniel Hahn is a 57 y.o. male who presents for Chief Complaint  Patient presents with   Medical Management of Chronic Issues    Med check, covid and flu today,     Patient Care Team: Aubry Tucholski, Cleda Mccreedy as PCP - General (Family Medicine) Eye doctor  Dentist Dr. Rhoderick Moody, Urology Dr. Yancey Flemings, GI   Concerns: Compliant with metformin 850mg  daily for prediabetes  Compliant with crestor 10mg  daily without c/o  Uses omeprazole 20mg  every few days, trying to taper off some.   Active on the job, 6-8k steps daily   Reviewed their medical, surgical, family, social, medication, and allergy history and updated chart as appropriate.  Allergies  Allergen Reactions   Armoracia Rusticana Ext (Horseradish) Itching    Scratchy throat   Onion Nausea And Vomiting   Sunflower Oil    Penicillins Rash    Has patient had a PCN reaction causing immediate rash, facial/tongue/throat swelling, SOB or lightheadedness with hypotension: {unknown Has patient had a PCN reaction causing severe rash involving mucus membranes or skin necrosis: {no Has patient had a PCN reaction that required hospitalization {no Has patient had a PCN reaction occurring within the last 10 years: {no If all of the above answers are "NO", then may proceed with Cephalosporin use.    Past Medical History:  Diagnosis Date   Allergy    Distal radius fracture, right 08/2015   FOOSH type injury   GERD (gastroesophageal reflux disease)    Hearing loss    history of working around loud machinery   History of kidney stones    History of sinusitis    yearly   Leg pain, diffuse    Meningitis    6 months old   Plantar fasciitis    Pre-diabetes     Current Outpatient Medications on File Prior to Visit  Medication Sig Dispense Refill   metFORMIN (GLUCOPHAGE) 850 MG tablet TAKE ONE TABLET BY MOUTH ONE TIME DAILY 30 tablet 0   Multiple Vitamins-Minerals (MULTIVITAMIN ADULT) CHEW Chew 1  each by mouth daily.     omeprazole (PRILOSEC) 20 MG capsule TAKE ONE CAPSULE BY MOUTH ONE TIME DAILY 90 capsule 0   rosuvastatin (CRESTOR) 10 MG tablet TAKE ONE TABLET BY MOUTH ONE TIME DAILY 90 tablet 1   No current facility-administered medications on file prior to visit.      Current Outpatient Medications:    metFORMIN (GLUCOPHAGE) 850 MG tablet, TAKE ONE TABLET BY MOUTH ONE TIME DAILY, Disp: 30 tablet, Rfl: 0   Multiple Vitamins-Minerals (MULTIVITAMIN ADULT) CHEW, Chew 1 each by mouth daily., Disp: , Rfl:    omeprazole (PRILOSEC) 20 MG capsule, TAKE ONE CAPSULE BY MOUTH ONE TIME DAILY, Disp: 90 capsule, Rfl: 0   rosuvastatin (CRESTOR) 10 MG tablet, TAKE ONE TABLET BY MOUTH ONE TIME DAILY, Disp: 90 tablet, Rfl: 1  Family History  Problem Relation Age of Onset   COPD Mother    Hiatal hernia Mother    Colon polyps Mother    Asthma Sister    Stroke Sister    Heart disease Neg Hx    Hyperlipidemia Neg Hx    Hypertension Neg Hx    Cancer Neg Hx    Colon cancer Neg Hx     Past Surgical History:  Procedure Laterality Date   APPENDECTOMY     ESOPHAGEAL DILATION     ESOPHAGOGASTRODUODENOSCOPY (EGD) WITH PROPOFOL N/A 07/27/2016   Procedure: ESOPHAGOGASTRODUODENOSCOPY (EGD) WITH PROPOFOL; with  Dilation;  Surgeon: Hilarie Fredrickson, MD;  Location: [facility];  Service: Endoscopy;  Laterality: N/A;   OPEN REDUCTION INTERNAL FIXATION (ORIF) DISTAL RADIAL FRACTURE Right 08/03/2015   Procedure: OPEN REDUCTION INTERNAL FIXATION (ORIF) RIGHT DISTAL RADIUS FRACTURE;  Surgeon: Betha Loa, MD;  Location: [facility];  Service: Orthopedics;  Laterality: Right;   VASECTOMY     WISDOM TOOTH EXTRACTION      Review of Systems  Constitutional:  Negative for chills, fever, malaise/fatigue and weight loss.  HENT:  Negative for congestion, ear pain, hearing loss, sore throat and tinnitus.   Eyes:  Negative for blurred vision, pain and redness.  Respiratory:  Negative for cough,  hemoptysis and shortness of breath.   Cardiovascular:  Negative for chest pain, palpitations, orthopnea, claudication and leg swelling.  Gastrointestinal:  Negative for abdominal pain, blood in stool, constipation, diarrhea, nausea and vomiting.  Genitourinary:  Negative for dysuria, flank pain, frequency, hematuria and urgency.  Musculoskeletal:  Negative for falls, joint pain and myalgias.  Skin:  Negative for itching and rash.  Neurological:  Negative for dizziness, tingling, speech change, weakness and headaches.  Endo/Heme/Allergies:  Negative for polydipsia. Does not bruise/bleed easily.  Psychiatric/Behavioral:  Negative for depression and memory loss. The patient is not nervous/anxious and does not have insomnia.        Objective:  BP 110/68   Pulse 85   Wt (!) 359 lb (162.8 kg)   BMI 44.87 kg/m   General appearance: alert, no distress, WD/WN, Caucasian male Skin: unremarkable HEENT: normocephalic, conjunctiva/corneas normal, sclerae anicteric, PERRLA, EOMi, nares patent, no discharge or erythema, pharynx normal Oral cavity: MMM, tongue normal, teeth normal Neck: supple, no lymphadenopathy, no thyromegaly, no masses, normal ROM, no bruits Chest: non tender, normal shape and expansion Heart: RRR, normal S1, S2, no murmurs Lungs: CTA bilaterally, no wheezes, rhonchi, or rales Abdomen: +bs, soft, non tender, non distended, no masses, no hepatomegaly, no splenomegaly, no bruits Back: non tender, normal ROM, no scoliosis Musculoskeletal: upper extremities non tender, no obvious deformity, normal ROM throughout, lower extremities non tender, no obvious deformity, normal ROM throughout Extremities: mild bilat LE varicose veins, otherwise no edema, no cyanosis, no clubbing Pulses: 2+ symmetric, upper and lower extremities, normal cap refill Neurological: alert, oriented x 3, CN2-12 intact, strength normal upper extremities and lower extremities, sensation normal throughout, DTRs 2+  throughout, no cerebellar signs, gait normal Psychiatric: normal affect, behavior normal, pleasant  GU/rectal - deferred/declined    Assessment and Plan :   Encounter Diagnoses  Name Primary?   Encounter for health maintenance examination in adult Yes   Needs flu shot    COVID-19 vaccine administered    Elevated uric acid in blood    Vaccine counseling    Varicose veins of both lower extremities, unspecified whether complicated    Thyroid nodule    Screening for prostate cancer    Screening for heart disease    Screen for colon cancer    Impaired fasting blood sugar    Hyperlipidemia, unspecified hyperlipidemia type    Gastroesophageal reflux disease, unspecified whether esophagitis present     This visit was a preventative care visit, also known as wellness visit or routine physical.   Topics typically include healthy lifestyle, diet, exercise, preventative care, vaccinations, sick and well care, proper use of emergency dept and after hours care, as well as other concerns.     Separate significant issues discussed: Hyperlipidemia - on statin, updated labs today  Prediabetes -  on metformin, labs today  Obesity - continue efforts to lose weight through health diet and exercise  GERD - on omeprazole every few days    General Recommendations: Continue to return yearly for your annual wellness and preventative care visits.  This gives [place] a chance to discuss healthy lifestyle, exercise, vaccinations, review your chart record, and perform screenings where appropriate.  I recommend you see your eye doctor yearly for routine vision care.  I recommend you see your dentist yearly for routine dental care including hygiene visits twice yearly.   Vaccination  Immunization History  Administered Date(s) Administered   Influenza, Seasonal, Injecte, Preservative Fre 04/26/2023   Influenza,inj,Quad PF,6+ Mos 03/16/2020, 06/23/2021   PFIZER Comirnaty(Gray Top)Covid-19 Tri-Sucrose  Vaccine 12/13/2020   PFIZER(Purple Top)SARS-COV-2 Vaccination 09/25/2019, 10/20/2019, 06/23/2020   Pfizer(Comirnaty)Fall Seasonal Vaccine 12 years and older 04/26/2023   Pneumococcal Polysaccharide-23 06/23/2021   Td 03/16/2020   Tdap 01/05/2010    Vaccine recommendations: Counseled on the influenza virus vaccine.  Vaccine information sheet given.  Influenza vaccine given after consent obtained.  Counseled on the Covid virus vaccine.  Vaccine information sheet given.  Covid vaccine given after consent obtained.  Consider Shingrix vaccine as well.    Screening for cancer: Colon cancer screening: We will refer you for screening colonoscopy  Prostate Cancer screening: The recommended prostate cancer screening test is a blood test called the prostate-specific antigen (PSA) test. PSA is a protein that is made in the prostate. As you age, your prostate naturally produces more PSA. Abnormally high PSA levels may be caused by: Prostate cancer. An enlarged prostate that is not caused by cancer (benign prostatic hyperplasia, or BPH). This condition is very common in older men. A prostate gland infection (prostatitis) or urinary tract infection. Certain medicines such as male hormones (like testosterone) or other medicines that raise testosterone levels. A rectal exam may be done as part of prostate cancer screening to help provide information about the size of your prostate gland. When a rectal exam is performed, it should be done after the PSA level is drawn to avoid any effect on the results.   Skin cancer screening: Check your skin regularly for new changes, growing lesions, or other lesions of concern Come in for evaluation if you have skin lesions of concern.   Lung cancer screening: If you have a greater than 20 pack year history of tobacco use, then you may qualify for lung cancer screening with a chest CT scan.   Please call your insurance company to inquire about coverage for this  test.   Pancreatic cancer:  no current screening test is available or routinely recommended. (risk factors: smoking, overweight or obese, diabetes, chronic pancreatitis, work exposure - dry cleaning, metal working, 57yo>, M>F, Tree surgeon, family hx/o, hereditary breast, ovarian, melanoma, lynch, peutz-jeghers).  Symptoms: jaundice, dark urine, light color or greasy stools, itchy skin, belly or back pain, weight loss, poor appetite, nausea, vomiting, liver enlargement, DVT/blood clots.   We currently don't have screenings for other cancers besides breast, cervical, colon, and lung cancers.  If you have a strong family history of cancer or have other cancer screening concerns, please let me know.  Genetic testing referral is an option for individuals with high cancer risk in the family.  There are some other cancer screenings in development currently.   Bone health: Get at least 150 minutes of aerobic exercise weekly Get weight bearing exercise at least once weekly Bone density test:  A bone density test is  an imaging test that uses a type of X-ray to measure the amount of calcium and other minerals in your bones. The test may be used to diagnose or screen you for a condition that causes weak or thin bones (osteoporosis), predict your risk for a broken bone (fracture), or determine how well your osteoporosis treatment is working. The bone density test is recommended for females 65 and older, or females or males <65 if certain risk factors such as thyroid disease, long term use of steroids such as for asthma or rheumatological issues, vitamin D deficiency, estrogen deficiency, family history of osteoporosis, self or family history of fragility fracture in first degree relative.    Heart health: Get at least 150 minutes of aerobic exercise weekly Limit alcohol It is important to maintain a healthy blood pressure and healthy cholesterol numbers  Heart disease screening: Screening for heart  disease includes screening for blood pressure, fasting lipids, glucose/diabetes screening, BMI height to weight ratio, reviewed of smoking status, physical activity, and diet.    Goals include blood pressure 120/80 or less, maintaining a healthy lipid/cholesterol profile, preventing diabetes or keeping diabetes numbers under good control, not smoking or using tobacco products, exercising most days per week or at least 150 minutes per week of exercise, and eating healthy variety of fruits and vegetables, healthy oils, and avoiding unhealthy food choices like fried food, fast food, high sugar and high cholesterol foods.    Other tests may possibly include EKG test, CT coronary calcium score, echocardiogram, exercise treadmill stress test.     Consider CT coronary heart test, $95 cash pay   Vascular disease screening: For higher risk individuals including smokers, diabetics, patients with known heart disease or high blood pressure, kidney disease, and others, screening for vascular disease or atherosclerosis of the arteries is available.  Examples may include carotid ultrasound, abdominal aortic ultrasound, ABI blood flow screening in the legs, thoracic aorta screening.  Medical care options: I recommend you continue to seek care here first for routine care.  We try really hard to have available appointments Monday through Friday daytime hours for sick visits, acute visits, and physicals.  Urgent care should be used for after hours and weekends for significant issues that cannot wait till the next day.  The emergency department should be used for significant potentially life-threatening emergencies.  The emergency department is expensive, can often have long wait times for less significant concerns, so try to utilize primary care, urgent care, or telemedicine when possible to avoid unnecessary trips to the emergency department.  Virtual visits and telemedicine have been introduced since the pandemic  started in 2020, and can be convenient ways to receive medical care.  We offer virtual appointments as well to assist you in a variety of options to seek medical care.   Legal  Take the time to do a last will and testament, Advanced Directives including Health Care Power of Attorney and Living Will documents.  Don't leave your family with burdens that can be handled ahead of time.   Advanced Directives: I recommend you consider completing a Health Care Power of Attorney and Living Will.   These documents respect your wishes and help alleviate burdens on your loved ones if you were to become terminally ill or be in a position to need those documents enforced.    You can complete Advanced Directives yourself, have them notarized, then have copies made for our office, for you and for anybody you feel should have them in safe  keeping.  Or, you can have an attorney prepare these documents.   If you haven't updated your Last Will and Testament in a while, it may be worthwhile having an attorney prepare these documents together and save on some costs.       Spiritual and Emotional Health Keeping a healthy spiritual life can help you better manage your physical health. Your spiritual life can help you to cope with any issues that may arise with your physical health.  Balance can keep [place] healthy and help [place] to recover.  If you are struggling with your spiritual health there are questions that you may want to ask yourself:  What makes me feel most complete? When do I feel most connected to the rest of the world? Where do I find the most inner strength? What am I doing when I feel whole?  Helpful tips: Being in nature. Some people feel very connected and at peace when they are walking outdoors or are outside. Helping others. Some feel the largest sense of wellbeing when they are of service to others. Being of service can take on many forms. It can be doing volunteer work, being kind to strangers, or  offering a hand to a friend in need. Gratitude. Some people find they feel the most connected when they remain grateful. They may make lists of all the things they are grateful for or say a thank you out loud for all they have.    Emotional Health Are you in tune with your emotional health?  Check out this link: http://www.marquez-love.com/    Financial Health Make sure you use a budget for your personal finances Make sure you are insured against risks (health insurance, life insurance, auto insurance, etc) Save more, spend less Set financial goals If you need help in this area, good resources include counseling through [employer] or other community resources, have a meeting with a Social research officer, government, and a good resource is the [employer] was seen today for medical management of chronic issues.  Diagnoses and all orders for this visit:  Encounter for health maintenance examination in adult -     Cancel: Ambulatory referral to Gastroenterology -     Comprehensive metabolic panel -     CBC with Differential/Platelet -     Lipid panel -     Hemoglobin A1c -     TSH -     PSA -     POCT Urinalysis DIP (Proadvantage Device) -     Uric acid -     TSH + free T4 -     Ambulatory referral to Gastroenterology  Needs flu shot -     Flu vaccine trivalent PF, 6mos and older(Flulaval,Afluria,Fluarix,Fluzone)  COVID-19 vaccine administered -     Pfizer Comirnaty Covid -19 Vaccine 52yrs and older  Elevated uric acid in blood -     Uric acid  Vaccine counseling  Varicose veins of both lower extremities, unspecified whether complicated  Thyroid nodule -     TSH + free T4  Screening for prostate cancer  Screening for heart disease  Screen for colon cancer -     Cancel: Ambulatory referral to Gastroenterology -     Ambulatory referral to Gastroenterology  Impaired fasting blood sugar -     Hemoglobin A1c  Hyperlipidemia, unspecified  hyperlipidemia type -     Lipid panel  Gastroesophageal reflux disease, unspecified whether esophagitis present   Follow-up pending labs, yearly for  physical

## 2023-04-27 ENCOUNTER — Other Ambulatory Visit: Payer: Self-pay | Admitting: Medical

## 2023-04-27 LAB — CBC WITH DIFFERENTIAL/PLATELET
Basophils Absolute: 0 10*3/uL (ref 0.0–0.2)
Basos: 1 %
EOS (ABSOLUTE): 0.2 10*3/uL (ref 0.0–0.4)
Eos: 3 %
Hematocrit: 45.1 % (ref 37.5–51.0)
Hemoglobin: 15.3 g/dL (ref 13.0–17.7)
Immature Grans (Abs): 0 10*3/uL (ref 0.0–0.1)
Immature Granulocytes: 0 %
Lymphocytes Absolute: 1.3 10*3/uL (ref 0.7–3.1)
Lymphs: 18 %
MCH: 30.2 pg (ref 26.6–33.0)
MCHC: 33.9 g/dL (ref 31.5–35.7)
MCV: 89 fL (ref 79–97)
Monocytes Absolute: 0.5 10*3/uL (ref 0.1–0.9)
Monocytes: 7 %
Neutrophils Absolute: 5.1 10*3/uL (ref 1.4–7.0)
Neutrophils: 71 %
Platelets: 275 10*3/uL (ref 150–450)
RBC: 5.07 x10E6/uL (ref 4.14–5.80)
RDW: 12.2 % (ref 11.6–15.4)
WBC: 7.1 10*3/uL (ref 3.4–10.8)

## 2023-04-27 LAB — COMPREHENSIVE METABOLIC PANEL
ALT: 34 [IU]/L (ref 0–44)
AST: 23 [IU]/L (ref 0–40)
Albumin: 4.5 g/dL (ref 3.8–4.9)
Alkaline Phosphatase: 96 [IU]/L (ref 44–121)
BUN/Creatinine Ratio: 22 — ABNORMAL HIGH (ref 9–20)
BUN: 20 mg/dL (ref 6–24)
Bilirubin Total: 0.6 mg/dL (ref 0.0–1.2)
CO2: 22 mmol/L (ref 20–29)
Calcium: 9.5 mg/dL (ref 8.7–10.2)
Chloride: 103 mmol/L (ref 96–106)
Creatinine, Ser: 0.89 mg/dL (ref 0.76–1.27)
Globulin, Total: 2.4 g/dL (ref 1.5–4.5)
Glucose: 125 mg/dL — ABNORMAL HIGH (ref 70–99)
Potassium: 3.7 mmol/L (ref 3.5–5.2)
Sodium: 140 mmol/L (ref 134–144)
Total Protein: 6.9 g/dL (ref 6.0–8.5)
eGFR: 99 mL/min/{1.73_m2} (ref >59)

## 2023-04-27 LAB — LIPID PANEL
Chol/HDL Ratio: 4.2 ratio (ref 0.0–5.0)
Cholesterol, Total: 127 mg/dL (ref 100–199)
HDL: 30 mg/dL — ABNORMAL LOW (ref >39)
LDL Chol Calc (NIH): 71 mg/dL (ref 0–99)
Triglycerides: 147 mg/dL (ref 0–149)
VLDL Cholesterol Cal: 26 mg/dL (ref 5–40)

## 2023-04-27 LAB — HEMOGLOBIN A1C
Est. average glucose Bld gHb Est-mCnc: 143 mg/dL
Hgb A1c MFr Bld: 6.6 % — ABNORMAL HIGH (ref 4.8–5.6)

## 2023-04-27 LAB — PSA: Prostate Specific Ag, Serum: 1 ng/mL (ref 0.0–4.0)

## 2023-04-27 LAB — TSH+FREE T4
Free T4: 1.46 ng/dL (ref 0.82–1.77)
TSH: 1.28 u[IU]/mL (ref 0.450–4.500)

## 2023-04-27 LAB — URIC ACID: Uric Acid: 10 mg/dL — ABNORMAL HIGH (ref 3.8–8.4)

## 2023-04-27 MED ORDER — OMEPRAZOLE 20 MG PO CPDR: 20.0000 mg | DELAYED_RELEASE_CAPSULE | Freq: Every day | ORAL | 3 refills | Status: DC

## 2023-04-27 MED ORDER — ROSUVASTATIN CALCIUM 10 MG PO TABS: 10.0000 mg | ORAL_TABLET | Freq: Every day | ORAL | 3 refills | Status: DC

## 2023-04-27 MED ORDER — METFORMIN HCL 850 MG PO TABS: 850.0000 mg | ORAL_TABLET | Freq: Every day | ORAL | 3 refills | Status: DC

## 2023-04-27 NOTE — Progress Notes (Signed)
Results sent through MyChart

## 2023-04-30 ENCOUNTER — Other Ambulatory Visit: Payer: Self-pay | Admitting: Medical

## 2023-04-30 MED ORDER — ALLOPURINOL 100 MG PO TABS: 100.0000 mg | ORAL_TABLET | Freq: Every day | ORAL | 2 refills | Status: DC

## 2024-05-26 ENCOUNTER — Telehealth: Payer: Self-pay | Admitting: Medical

## 2024-05-26 NOTE — Telephone Encounter (Signed)
 Copied from CRM [id number]. Topic: Appointments - Scheduling Inquiry for Clinic >> May 26, 2024  4:11 PM China J wrote: Reason for CRM: Patient would like to know if [facility] medical assistant could find an opening for a physical on 12/10.  Returned call to pt about sooner physical appt, he is currently scheduled 06/04/24, no openings before that time, voice mail not set up

## 2024-06-04 ENCOUNTER — Other Ambulatory Visit: Payer: Self-pay

## 2024-06-04 ENCOUNTER — Ambulatory Visit (INDEPENDENT_AMBULATORY_CARE_PROVIDER_SITE_OTHER): Admitting: Medical

## 2024-06-04 ENCOUNTER — Encounter: Payer: Self-pay | Admitting: Medical

## 2024-06-04 VITALS — BP 122/80 | HR 84 | Ht 75.0 in | Wt 360.0 lb

## 2024-06-04 DIAGNOSIS — Z136 Encounter for screening for cardiovascular disorders: Secondary | ICD-10-CM

## 2024-06-04 DIAGNOSIS — Z7984 Long term (current) use of oral hypoglycemic drugs: Secondary | ICD-10-CM

## 2024-06-04 DIAGNOSIS — E118 Type 2 diabetes mellitus with unspecified complications: Secondary | ICD-10-CM | POA: Diagnosis not present

## 2024-06-04 DIAGNOSIS — I8393 Asymptomatic varicose veins of bilateral lower extremities: Secondary | ICD-10-CM | POA: Diagnosis not present

## 2024-06-04 DIAGNOSIS — Z0000 Encounter for general adult medical examination without abnormal findings: Secondary | ICD-10-CM

## 2024-06-04 DIAGNOSIS — L989 Disorder of the skin and subcutaneous tissue, unspecified: Secondary | ICD-10-CM

## 2024-06-04 DIAGNOSIS — Z1211 Encounter for screening for malignant neoplasm of colon: Secondary | ICD-10-CM | POA: Diagnosis not present

## 2024-06-04 DIAGNOSIS — E785 Hyperlipidemia, unspecified: Secondary | ICD-10-CM

## 2024-06-04 DIAGNOSIS — E790 Hyperuricemia without signs of inflammatory arthritis and tophaceous disease: Secondary | ICD-10-CM | POA: Diagnosis not present

## 2024-06-04 DIAGNOSIS — Z7185 Encounter for immunization safety counseling: Secondary | ICD-10-CM

## 2024-06-04 DIAGNOSIS — Z125 Encounter for screening for malignant neoplasm of prostate: Secondary | ICD-10-CM

## 2024-06-04 DIAGNOSIS — K219 Gastro-esophageal reflux disease without esophagitis: Secondary | ICD-10-CM

## 2024-06-04 MED ORDER — METFORMIN HCL 850 MG PO TABS: 850.0000 mg | ORAL_TABLET | Freq: Every day | ORAL | 3 refills | Status: AC

## 2024-06-04 MED ORDER — ROSUVASTATIN CALCIUM 10 MG PO TABS: 10.0000 mg | ORAL_TABLET | Freq: Every day | ORAL | 3 refills | Status: AC

## 2024-06-04 NOTE — Progress Notes (Signed)
 Subjective:   HPI  Nathaniel Hahn is a 58 y.o. male who presents for Chief Complaint  Patient presents with   Annual Exam    Fasting cpe, had 3 cases of hiccups during his sleep- ongoing acid reflux    Patient Care Team: Talonda Artist, Alm GORMAN RIGGERS as PCP - General (Family Medicine) Eye doctor  Dentist Dr. Lonni Han, Urology Dr. Norleen Kiang, GI   Concerns: He notes some hiccups of late, has some reflux problems and concern about a skin bump on his finger.  Wants referral to dermatology.  Growth on left pinky finger.  Nathaniel Hahn is a 58 year old male who presents for well visit and hiccups and a bump on his finger.  He has been experiencing some hiccups that have intensified recently. These episodes may be related to his acid reflux, which recurred after he stopped taking his acid medication in January or February. Certain foods, such as pizza, seem to trigger the hiccups, and he experienced reflux that woke him up at night after consuming pizza for dinner.  He seeks evaluation for a bump on his left pinky finger, present for about two years. His father-in-law, a retired armed forces operational officer, examined it and noted it is filled with blood and not a wart. He advised careful handling due to its proximity to nerves and other structures.  He is currently on metformin  850 mg once daily, a multivitamin, and Crestor  10 mg. His past medical history includes kidney stones, appendectomy, vasectomy, arm surgery from a fracture, sinus surgery, and removal of multiple fatty tumors in his early twenties.  He is allergic to horseradish, onions, sunflower oil, and penicillin. He does not smoke and rarely consumes alcohol, drinking maybe half a dozen times a year. 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 His family history is notable for no known cancers or heart problems. He lives with his wife, son, and daughter-in-law.  Reviewed their medical, surgical, family,  social, medication, and allergy history and updated chart as appropriate.  Allergies  Allergen Reactions   Armoracia Rusticana Ext (Horseradish) Itching    Scratchy throat   Onion Nausea And Vomiting   Sunflower Oil    Penicillins Rash    Has patient had a PCN reaction causing immediate rash, facial/tongue/throat swelling, SOB or lightheadedness with hypotension: {unknown Has patient had a PCN reaction causing severe rash involving mucus membranes or skin necrosis: {no Has patient had a PCN reaction that required hospitalization {no Has patient had a PCN reaction occurring within the last 10 years: {no If all of the above answers are NO, then may proceed with Cephalosporin use.    Past Medical History:  Diagnosis Date   Allergy    Diabetes (HCC) 2024   Distal radius fracture, right 08/2015   FOOSH type injury   GERD (gastroesophageal reflux disease)    Hearing loss    history of working around loud machinery   History of kidney stones    History of sinusitis    yearly   Leg pain, diffuse    Meningitis    6 months old   Plantar fasciitis     Current Outpatient Medications on File Prior to Visit  Medication Sig Dispense Refill   metFORMIN  (GLUCOPHAGE ) 850 MG tablet Take 1 tablet (850 mg total) by mouth daily. 90 tablet 3   Multiple Vitamins-Minerals (MULTIVITAMIN ADULT) CHEW Chew 1 each by mouth daily.     rosuvastatin  (CRESTOR ) 10 MG tablet Take 1 tablet (  10 mg total) by mouth daily. 90 tablet 3   No current facility-administered medications on file prior to visit.      Current Outpatient Medications:    metFORMIN  (GLUCOPHAGE ) 850 MG tablet, Take 1 tablet (850 mg total) by mouth daily., Disp: 90 tablet, Rfl: 3   Multiple Vitamins-Minerals (MULTIVITAMIN ADULT) CHEW, Chew 1 each by mouth daily., Disp: , Rfl:    rosuvastatin  (CRESTOR ) 10 MG tablet, Take 1 tablet (10 mg total) by mouth daily., Disp: 90 tablet, Rfl: 3  Family History  Problem Relation Age of Onset    COPD Mother    Hiatal hernia Mother    Colon polyps Mother    Asthma Sister    Stroke Sister    Heart disease Neg Hx    Hyperlipidemia Neg Hx    Hypertension Neg Hx    Cancer Neg Hx    Colon cancer Neg Hx     Past Surgical History:  Procedure Laterality Date   APPENDECTOMY     ESOPHAGEAL DILATION     ESOPHAGOGASTRODUODENOSCOPY (EGD) WITH PROPOFOL  N/A 07/27/2016   Procedure: ESOPHAGOGASTRODUODENOSCOPY (EGD) WITH PROPOFOL ; with Dilation;  Surgeon: Norleen LOISE Kiang, MD;  Location: [facility];  Service: Endoscopy;  Laterality: N/A;   LIPOMA EXCISION     multiple prior   OPEN REDUCTION INTERNAL FIXATION (ORIF) DISTAL RADIAL FRACTURE Right 08/03/2015   Procedure: OPEN REDUCTION INTERNAL FIXATION (ORIF) RIGHT DISTAL RADIUS FRACTURE;  Surgeon: Franky Curia, MD;  Location: [facility];  Service: Orthopedics;  Laterality: Right;   VASECTOMY     WISDOM TOOTH EXTRACTION      Review of Systems  Constitutional:  Negative for chills, fever, malaise/fatigue and weight loss.  HENT:  Negative for congestion, ear pain, hearing loss, sore throat and tinnitus.   Eyes:  Negative for blurred vision, pain and redness.  Respiratory:  Negative for cough, hemoptysis and shortness of breath.   Cardiovascular:  Negative for chest pain, palpitations, orthopnea, claudication and leg swelling.  Gastrointestinal:  Negative for abdominal pain, blood in stool, constipation, diarrhea, nausea and vomiting.       Hiccups, some GERD recently after pizza  Genitourinary:  Negative for dysuria, flank pain, frequency, hematuria and urgency.  Musculoskeletal:  Negative for falls, joint pain and myalgias.  Skin:  Negative for itching and rash.  Neurological:  Negative for dizziness, tingling, speech change, weakness and headaches.  Endo/Heme/Allergies:  Negative for polydipsia. Does not bruise/bleed easily.  Psychiatric/Behavioral:  Negative for depression and memory loss. The patient is not nervous/anxious and  does not have insomnia.       Objective:  BP 122/80   Pulse 84   Ht 6' 3 (1.905 m)   Wt (!) 360 lb (163.3 kg)   BMI 45.00 kg/m   BP Readings from Last 3 Encounters:  06/04/24 122/80  04/26/23 110/68  07/26/22 124/80   Wt Readings from Last 3 Encounters:  06/04/24 (!) 360 lb (163.3 kg)  04/26/23 (!) 359 lb (162.8 kg)  07/26/22 (!) 367 lb 6.4 oz (166.7 kg)    General appearance: alert, no distress, WD/WN, Caucasian male Skin: left 5th finger, volar surface of distal phalanx with 4mm raised dense growth, flesh colored, otherwise unremarkable HEENT: normocephalic, conjunctiva/corneas normal, sclerae anicteric, PERRLA, EOMi, nares patent, no discharge or erythema, pharynx normal Oral cavity: MMM, tongue normal, teeth normal Neck: supple, no lymphadenopathy, no thyromegaly, no masses, normal ROM, no bruits Chest: non tender, normal shape and expansion Heart: RRR, normal S1, S2, no murmurs  Lungs: CTA bilaterally, no wheezes, rhonchi, or rales Abdomen: +bs, soft, non tender, non distended, no masses, no hepatomegaly, no splenomegaly, no bruits Back: non tender, normal ROM, no scoliosis Musculoskeletal: upper extremities non tender, no obvious deformity, normal ROM throughout, lower extremities non tender, no obvious deformity, normal ROM throughout Extremities: mild bilat LE varicose veins, otherwise no edema, no cyanosis, no clubbing Pulses: 2+ symmetric, upper and lower extremities, normal cap refill Neurological: alert, oriented x 3, CN2-12 intact, strength normal upper extremities and lower extremities, sensation normal throughout, DTRs 2+ throughout, no cerebellar signs, gait normal Psychiatric: normal affect, behavior normal, pleasant  GU/rectal - deferred/declined  Diabetic Foot Exam - Simple   Simple Foot Form Diabetic Foot exam was performed with the following findings: Yes 06/04/2024  9:27 AM  Visual Inspection See comments: Yes Sensation Testing Intact to touch and  monofilament testing bilaterally: Yes Pulse Check Posterior Tibialis and Dorsalis pulse intact bilaterally: Yes Comments Right great toenail with some grayish discoloration and mild hypertrophy      Assessment and Plan :   Encounter Diagnoses  Name Primary?   Encounter for health maintenance examination in adult Yes   Screen for colon cancer    Type II diabetes mellitus with complication (HCC)    Vaccine counseling    Varicose veins of both lower extremities, unspecified whether complicated    Screening for prostate cancer    Screening for heart disease    Hyperlipidemia, unspecified hyperlipidemia type    Gastroesophageal reflux disease, unspecified whether esophagitis present    Elevated uric acid in blood    Morbid obesity (HCC)    Skin lesion of hand     This visit was a preventative care visit, also known as wellness visit or routine physical.   Topics typically include healthy lifestyle, diet, exercise, preventative care, vaccinations, sick and well care, proper use of emergency dept and after hours care, as well as other concerns.     Separate significant issues discussed: Hyperlipidemia -continue Crestor  10 mg daily -Consider CT coronary calcium  test to further evaluate for heart disease  Diabetes type 2 -Update labs today -For now continue metformin  -See your eye doctor yearly for diabetic eye exam -Consider GLP medication such as Mounjaro or Ozempic to help with blood sugar control weight loss -Check feet daily for sores or wounds  Morbid obesity associated with diabetes -I strongly recommend losing weight through healthy diet and exercise- -I recommend possibly seeing a nutritionist or significant changes in diet to lower calorie intake -Consider medication assistance to help with weight loss  skin lesion of left finger -referral to dermatology  History of elevated uric acid -Updated labs today  Varicose veins of the legs -I recommend he wear  compression socks or compression hose daily and continue walking and regular exercise  Hiccups, occasional GERD -Avoid GERD triggers, avoid spicy and acidic foods, avoid eating fast -Consider over-the-counter Tums or Pepcid if worse symptoms -If hiccups persist there are some other medications we can try -Follow-up with gastroenterology    General Recommendations: Continue to return yearly for your annual wellness and preventative care visits.  This gives us  a chance to discuss healthy lifestyle, exercise, vaccinations, review your chart record, and perform screenings where appropriate.  I recommend you see your eye doctor yearly for routine vision care.  I recommend you see your dentist yearly for routine dental care including hygiene visits twice yearly.   Vaccination  Immunization History  Administered Date(s) Administered   Influenza Split 06/17/2022,  04/08/2024   Influenza, Seasonal, Injecte, Preservative Fre 04/26/2023   Influenza,inj,Quad PF,6+ Mos 03/16/2020, 06/23/2021   PFIZER Comirnaty(Gray Top)Covid-19 Tri-Sucrose Vaccine 12/13/2020   PFIZER(Purple Top)SARS-COV-2 Vaccination 09/25/2019, 10/20/2019, 06/23/2020   Pfizer(Comirnaty)Fall Seasonal Vaccine 12 years and older 04/26/2023, 04/08/2024   Pneumococcal Polysaccharide-23 06/23/2021   Td 03/16/2020   Tdap 01/05/2010    Vaccine recommendations: Consider Shingrix vaccine as well.   He notes recent influenza vaccine at pharmacy and covid vaccine at [facility].    Screening for cancer: Colon cancer screening: Referred to GI last year, new referral placed today.  Last year his voicemail was full and he never go in touch with GI.     Prostate Cancer screening: The recommended prostate cancer screening test is a blood test called the prostate-specific antigen (PSA) test. PSA is a protein that is made in the prostate. As you age, your prostate naturally produces more PSA. Abnormally high PSA levels may be caused  by: Prostate cancer. An enlarged prostate that is not caused by cancer (benign prostatic hyperplasia, or BPH). This condition is very common in older men. A prostate gland infection (prostatitis) or urinary tract infection. Certain medicines such as male hormones (like testosterone) or other medicines that raise testosterone levels. A rectal exam may be done as part of prostate cancer screening to help provide information about the size of your prostate gland. When a rectal exam is performed, it should be done after the PSA level is drawn to avoid any effect on the results.   Skin cancer screening: Check your skin regularly for new changes, growing lesions, or other lesions of concern Come in for evaluation if you have skin lesions of concern.   Lung cancer screening: If you have a greater than 20 pack year history of tobacco use, then you may qualify for lung cancer screening with a chest CT scan.   Please call your insurance company to inquire about coverage for this test.   Pancreatic cancer:  no current screening test is available or routinely recommended. (risk factors: smoking, overweight or obese, diabetes, chronic pancreatitis, work exposure - dry cleaning, metal working, 58yo>, M>F, Tree Surgeon, family hx/o, hereditary breast, ovarian, melanoma, lynch, peutz-jeghers).  Symptoms: jaundice, dark urine, light color or greasy stools, itchy skin, belly or back pain, weight loss, poor appetite, nausea, vomiting, liver enlargement, DVT/blood clots.   We currently don't have screenings for other cancers besides breast, cervical, colon, and lung cancers.  If you have a strong family history of cancer or have other cancer screening concerns, please let me know.  Genetic testing referral is an option for individuals with high cancer risk in the family.  There are some other cancer screenings in development currently.   Bone health: Get at least 150 minutes of aerobic exercise weekly Get  weight bearing exercise at least once weekly Bone density test:  A bone density test is an imaging test that uses a type of X-ray to measure the amount of calcium  and other minerals in your bones. The test may be used to diagnose or screen you for a condition that causes weak or thin bones (osteoporosis), predict your risk for a broken bone (fracture), or determine how well your osteoporosis treatment is working. The bone density test is recommended for females 65 and older, or females or males <65 if certain risk factors such as thyroid  disease, long term use of steroids such as for asthma or rheumatological issues, vitamin D deficiency, estrogen deficiency, family history of osteoporosis, self or  family history of fragility fracture in first degree relative.    Heart health: Get at least 150 minutes of aerobic exercise weekly Limit alcohol It is important to maintain a healthy blood pressure and healthy cholesterol numbers  Heart disease screening: Screening for heart disease includes screening for blood pressure, fasting lipids, glucose/diabetes screening, BMI height to weight ratio, reviewed of smoking status, physical activity, and diet.    Goals include blood pressure 120/80 or less, maintaining a healthy lipid/cholesterol profile, preventing diabetes or keeping diabetes numbers under good control, not smoking or using tobacco products, exercising most days per week or at least 150 minutes per week of exercise, and eating healthy variety of fruits and vegetables, healthy oils, and avoiding unhealthy food choices like fried food, fast food, high sugar and high cholesterol foods.    Other tests may possibly include EKG test, CT coronary calcium  score, echocardiogram, exercise treadmill stress test.     Consider CT coronary heart test, $95 cash pay   Vascular disease screening: For higher risk individuals including smokers, diabetics, patients with known heart disease or high blood  pressure, kidney disease, and others, screening for vascular disease or atherosclerosis of the arteries is available.  Examples may include carotid ultrasound, abdominal aortic ultrasound, ABI blood flow screening in the legs, thoracic aorta screening.   Medical care options: I recommend you continue to seek care here first for routine care.  We try really hard to have available appointments Monday through Friday daytime hours for sick visits, acute visits, and physicals.  Urgent care should be used for after hours and weekends for significant issues that cannot wait till the next day.  The emergency department should be used for significant potentially life-threatening emergencies.  The emergency department is expensive, can often have long wait times for less significant concerns, so try to utilize primary care, urgent care, or telemedicine when possible to avoid unnecessary trips to the emergency department.  Virtual visits and telemedicine have been introduced since the pandemic started in 2020, and can be convenient ways to receive medical care.  We offer virtual appointments as well to assist you in a variety of options to seek medical care.   Legal  Take the time to do a last will and testament, Advanced Directives including Health Care Power of Attorney and Living Will documents.  Don't leave your family with burdens that can be handled ahead of time.   Advanced Directives: I recommend you consider completing a Health Care Power of Attorney and Living Will.   These documents respect your wishes and help alleviate burdens on your loved ones if you were to become terminally ill or be in a position to need those documents enforced.    You can complete Advanced Directives yourself, have them notarized, then have copies made for our office, for you and for anybody you feel should have them in safe keeping.  Or, you can have an attorney prepare these documents.   If you haven't updated your Last  Will and Testament in a while, it may be worthwhile having an attorney prepare these documents together and save on some costs.       Spiritual and Emotional Health Keeping a healthy spiritual life can help you better manage your physical health. Your spiritual life can help you to cope with any issues that may arise with your physical health.  Balance can keep us  healthy and help us  to recover.  If you are struggling with your spiritual health there are  questions that you may want to ask yourself:  What makes me feel most complete? When do I feel most connected to the rest of the world? Where do I find the most inner strength? What am I doing when I feel whole?  Helpful tips: Being in nature. Some people feel very connected and at peace when they are walking outdoors or are outside. Helping others. Some feel the largest sense of wellbeing when they are of service to others. Being of service can take on many forms. It can be doing volunteer work, being kind to strangers, or offering a hand to a friend in need. Gratitude. Some people find they feel the most connected when they remain grateful. They may make lists of all the things they are grateful for or say a thank you out loud for all they have.    Emotional Health Are you in tune with your emotional health?  Check out this link: Http://www.marquez-love.com/    Financial Health Make sure you use a budget for your personal finances Make sure you are insured against risks (health insurance, life insurance, auto insurance, etc) Save more, spend less Set financial goals If you need help in this area, good resources include counseling through [employer] or other community resources, have a meeting with a social research officer, government, and a good resource is the [employer]    Nathaniel Hahn was seen today for annual exam.  Diagnoses and all orders for this visit:  Encounter for health maintenance examination in  adult -     CBC -     Comprehensive metabolic panel with GFR -     Lipid panel -     TSH -     PSA -     Hemoglobin A1c -     Microalbumin/Creatinine Ratio, Urine -     Urinalysis, Routine w reflex microscopic -     Uric acid -     Ambulatory referral to Gastroenterology  Screen for colon cancer -     Ambulatory referral to Gastroenterology  Type II diabetes mellitus with complication (HCC) -     Hemoglobin A1c  Vaccine counseling  Varicose veins of both lower extremities, unspecified whether complicated  Screening for prostate cancer -     PSA  Screening for heart disease  Hyperlipidemia, unspecified hyperlipidemia type -     Lipid panel  Gastroesophageal reflux disease, unspecified whether esophagitis present  Elevated uric acid in blood -     Uric acid  Morbid obesity (HCC) -     TSH -     Hemoglobin A1c  Skin lesion of hand -     Ambulatory referral to Dermatology    Follow-up pending labs, yearly for physical

## 2024-06-04 NOTE — Patient Instructions (Addendum)
 Please call [facility] to schedule your colonoscopy Address: [address], [place], [place] [address] Phone: [phone]    This visit was a preventative care visit, also known as wellness visit or routine physical.   Topics typically include healthy lifestyle, diet, exercise, preventative care, vaccinations, sick and well care, proper use of emergency dept and after hours care, as well as other concerns.     Separate significant issues discussed: Hyperlipidemia -continue Crestor  10 mg daily -Consider CT coronary calcium  test to further evaluate for heart disease  Diabetes type 2 -Update labs today -For now continue metformin  -See your eye doctor yearly for diabetic eye exam -Consider GLP medication such as Mounjaro  or Ozempic to help with blood sugar control weight loss -Check feet daily for sores or wounds  Morbid obesity associated with diabetes -I strongly recommend losing weight through healthy diet and exercise- -I recommend possibly seeing a nutritionist or significant changes in diet to lower calorie intake -Consider medication assistance to help with weight loss  skin lesion of left finger -referral to dermatology  History of elevated uric acid -Updated labs today  Varicose veins of the legs -I recommend he wear compression socks or compression hose daily and continue walking and regular exercise  Hiccups, occasional GERD -Avoid GERD triggers, avoid spicy and acidic foods, avoid eating fast -Consider over-the-counter Tums or Pepcid if worse symptoms -If hiccups persist there are some other medications we can try -Follow-up with gastroenterology    General Recommendations: Continue to return yearly for your annual wellness and preventative care visits.  This gives us  a chance to discuss healthy lifestyle, exercise, vaccinations, review your chart record, and perform screenings where appropriate.  I recommend you see your eye doctor yearly for routine  vision care.  I recommend you see your dentist yearly for routine dental care including hygiene visits twice yearly.   Vaccination  Immunization History  Administered Date(s) Administered   Influenza Split 06/17/2022, 04/08/2024   Influenza, Seasonal, Injecte, Preservative Fre 04/26/2023   Influenza,inj,Quad PF,6+ Mos 03/16/2020, 06/23/2021   PFIZER Comirnaty(Gray Top)Covid-19 Tri-Sucrose Vaccine 12/13/2020   PFIZER(Purple Top)SARS-COV-2 Vaccination 09/25/2019, 10/20/2019, 06/23/2020   Pfizer(Comirnaty)Fall Seasonal Vaccine 12 years and older 04/26/2023, 04/08/2024   Pneumococcal Polysaccharide-23 06/23/2021   Td 03/16/2020   Tdap 01/05/2010    Vaccine recommendations: Consider Shingrix vaccine as well.   He notes recent influenza vaccine at pharmacy and covid vaccine at [facility].    Screening for cancer: Colon cancer screening: Referred to GI last year, new referral placed today.  Last year his voicemail was full and he never go in touch with GI.     Prostate Cancer screening: The recommended prostate cancer screening test is a blood test called the prostate-specific antigen (PSA) test. PSA is a protein that is made in the prostate. As you age, your prostate naturally produces more PSA. Abnormally high PSA levels may be caused by: Prostate cancer. An enlarged prostate that is not caused by cancer (benign prostatic hyperplasia, or BPH). This condition is very common in older men. A prostate gland infection (prostatitis) or urinary tract infection. Certain medicines such as male hormones (like testosterone) or other medicines that raise testosterone levels. A rectal exam may be done as part of prostate cancer screening to help provide information about the size of your prostate gland. When a rectal exam is performed, it should be done after the PSA level is drawn to avoid any effect on the results.   Skin cancer screening: Check  your skin regularly for new changes, growing  lesions, or other lesions of concern Come in for evaluation if you have skin lesions of concern.   Lung cancer screening: If you have a greater than 20 pack year history of tobacco use, then you may qualify for lung cancer screening with a chest CT scan.   Please call your insurance company to inquire about coverage for this test.   Pancreatic cancer:  no current screening test is available or routinely recommended. (risk factors: smoking, overweight or obese, diabetes, chronic pancreatitis, work exposure - dry cleaning, metal working, 58yo>, M>F, Tree Surgeon, family hx/o, hereditary breast, ovarian, melanoma, lynch, peutz-jeghers).  Symptoms: jaundice, dark urine, light color or greasy stools, itchy skin, belly or back pain, weight loss, poor appetite, nausea, vomiting, liver enlargement, DVT/blood clots.   We currently don't have screenings for other cancers besides breast, cervical, colon, and lung cancers.  If you have a strong family history of cancer or have other cancer screening concerns, please let me know.  Genetic testing referral is an option for individuals with high cancer risk in the family.  There are some other cancer screenings in development currently.   Bone health: Get at least 150 minutes of aerobic exercise weekly Get weight bearing exercise at least once weekly Bone density test:  A bone density test is an imaging test that uses a type of X-ray to measure the amount of calcium  and other minerals in your bones. The test may be used to diagnose or screen you for a condition that causes weak or thin bones (osteoporosis), predict your risk for a broken bone (fracture), or determine how well your osteoporosis treatment is working. The bone density test is recommended for females 65 and older, or females or males <65 if certain risk factors such as thyroid  disease, long term use of steroids such as for asthma or rheumatological issues, vitamin D deficiency, estrogen  deficiency, family history of osteoporosis, self or family history of fragility fracture in first degree relative.    Heart health: Get at least 150 minutes of aerobic exercise weekly Limit alcohol It is important to maintain a healthy blood pressure and healthy cholesterol numbers  Heart disease screening: Screening for heart disease includes screening for blood pressure, fasting lipids, glucose/diabetes screening, BMI height to weight ratio, reviewed of smoking status, physical activity, and diet.    Goals include blood pressure 120/80 or less, maintaining a healthy lipid/cholesterol profile, preventing diabetes or keeping diabetes numbers under good control, not smoking or using tobacco products, exercising most days per week or at least 150 minutes per week of exercise, and eating healthy variety of fruits and vegetables, healthy oils, and avoiding unhealthy food choices like fried food, fast food, high sugar and high cholesterol foods.    Other tests may possibly include EKG test, CT coronary calcium  score, echocardiogram, exercise treadmill stress test.     Consider CT coronary heart test, $95 cash pay   Vascular disease screening: For higher risk individuals including smokers, diabetics, patients with known heart disease or high blood pressure, kidney disease, and others, screening for vascular disease or atherosclerosis of the arteries is available.  Examples may include carotid ultrasound, abdominal aortic ultrasound, ABI blood flow screening in the legs, thoracic aorta screening.   Medical care options: I recommend you continue to seek care here first for routine care.  We try really hard to have available appointments Monday through Friday daytime hours for sick visits, acute visits, and physicals.  Urgent care should be used for after hours and weekends for significant issues that cannot wait till the next day.  The emergency department should be used for significant potentially  life-threatening emergencies.  The emergency department is expensive, can often have long wait times for less significant concerns, so try to utilize primary care, urgent care, or telemedicine when possible to avoid unnecessary trips to the emergency department.  Virtual visits and telemedicine have been introduced since the pandemic started in 2020, and can be convenient ways to receive medical care.  We offer virtual appointments as well to assist you in a variety of options to seek medical care.   Legal  Take the time to do a last will and testament, Advanced Directives including Health Care Power of Attorney and Living Will documents.  Don't leave your family with burdens that can be handled ahead of time.   Advanced Directives: I recommend you consider completing a Health Care Power of Attorney and Living Will.   These documents respect your wishes and help alleviate burdens on your loved ones if you were to become terminally ill or be in a position to need those documents enforced.    You can complete Advanced Directives yourself, have them notarized, then have copies made for our office, for you and for anybody you feel should have them in safe keeping.  Or, you can have an attorney prepare these documents.   If you haven't updated your Last Will and Testament in a while, it may be worthwhile having an attorney prepare these documents together and save on some costs.       Spiritual and Emotional Health Keeping a healthy spiritual life can help you better manage your physical health. Your spiritual life can help you to cope with any issues that may arise with your physical health.  Balance can keep us  healthy and help us  to recover.  If you are struggling with your spiritual health there are questions that you may want to ask yourself:  What makes me feel most complete? When do I feel most connected to the rest of the world? Where do I find the most inner strength? What am I doing when  I feel whole?  Helpful tips: Being in nature. Some people feel very connected and at peace when they are walking outdoors or are outside. Helping others. Some feel the largest sense of wellbeing when they are of service to others. Being of service can take on many forms. It can be doing volunteer work, being kind to strangers, or offering a hand to a friend in need. Gratitude. Some people find they feel the most connected when they remain grateful. They may make lists of all the things they are grateful for or say a thank you out loud for all they have.    Emotional Health Are you in tune with your emotional health?  Check out this link: Http://www.marquez-love.com/    Financial Health Make sure you use a budget for your personal finances Make sure you are insured against risks (health insurance, life insurance, auto insurance, etc) Save more, spend less Set financial goals If you need help in this area, good resources include counseling through [employer] or other community resources, have a meeting with a social research officer, government, and a good resource is the [employer]

## 2024-06-05 ENCOUNTER — Ambulatory Visit: Payer: Self-pay | Admitting: Medical

## 2024-06-05 LAB — COMPREHENSIVE METABOLIC PANEL WITH GFR
ALT: 31 IU/L (ref 0–44)
AST: 24 IU/L (ref 0–40)
Albumin: 4.4 g/dL (ref 3.8–4.9)
Alkaline Phosphatase: 89 IU/L (ref 47–123)
BUN/Creatinine Ratio: 23 — ABNORMAL HIGH (ref 9–20)
BUN: 17 mg/dL (ref 6–24)
Bilirubin Total: 0.5 mg/dL (ref 0.0–1.2)
CO2: 20 mmol/L (ref 20–29)
Calcium: 9 mg/dL (ref 8.7–10.2)
Chloride: 101 mmol/L (ref 96–106)
Creatinine, Ser: 0.75 mg/dL — ABNORMAL LOW (ref 0.76–1.27)
Globulin, Total: 2.4 g/dL (ref 1.5–4.5)
Glucose: 194 mg/dL — ABNORMAL HIGH (ref 70–99)
Potassium: 4 mmol/L (ref 3.5–5.2)
Sodium: 140 mmol/L (ref 134–144)
Total Protein: 6.8 g/dL (ref 6.0–8.5)
eGFR: 104 mL/min/1.73 (ref >59)

## 2024-06-05 LAB — MICROALBUMIN / CREATININE URINE RATIO
Creatinine, Urine: 218.8 mg/dL
Microalb/Creat Ratio: 14 mg/g{creat} (ref 0–29)
Microalbumin, Urine: 30.7 ug/mL

## 2024-06-05 LAB — URINALYSIS, ROUTINE W REFLEX MICROSCOPIC
Bilirubin, UA: NEGATIVE
Glucose, UA: NEGATIVE
Ketones, UA: NEGATIVE
Nitrite, UA: NEGATIVE
RBC, UA: NEGATIVE
Specific Gravity, UA: 1.025 (ref 1.005–1.030)
Urobilinogen, Ur: 1 mg/dL (ref 0.2–1.0)
pH, UA: 5.5 (ref 5.0–7.5)

## 2024-06-05 LAB — CBC
Hematocrit: 44.1 % (ref 37.5–51.0)
Hemoglobin: 14.7 g/dL (ref 13.0–17.7)
MCH: 30.9 pg (ref 26.6–33.0)
MCHC: 33.3 g/dL (ref 31.5–35.7)
MCV: 93 fL (ref 79–97)
Platelets: 262 x10E3/uL (ref 150–450)
RBC: 4.75 x10E6/uL (ref 4.14–5.80)
RDW: 12.7 % (ref 11.6–15.4)
WBC: 8.5 x10E3/uL (ref 3.4–10.8)

## 2024-06-05 LAB — TSH: TSH: 2.48 u[IU]/mL (ref 0.450–4.500)

## 2024-06-05 LAB — LIPID PANEL
Chol/HDL Ratio: 3.3 ratio (ref 0.0–5.0)
Cholesterol, Total: 130 mg/dL (ref 100–199)
HDL: 39 mg/dL — ABNORMAL LOW (ref >39)
LDL Chol Calc (NIH): 71 mg/dL (ref 0–99)
Triglycerides: 107 mg/dL (ref 0–149)
VLDL Cholesterol Cal: 20 mg/dL (ref 5–40)

## 2024-06-05 LAB — HEMOGLOBIN A1C
Est. average glucose Bld gHb Est-mCnc: 154 mg/dL
Hgb A1c MFr Bld: 7 % — ABNORMAL HIGH (ref 4.8–5.6)

## 2024-06-05 LAB — MICROSCOPIC EXAMINATION
Bacteria, UA: NONE SEEN
Casts: NONE SEEN /LPF
Epithelial Cells (non renal): NONE SEEN /HPF (ref 0–10)
WBC, UA: NONE SEEN /HPF (ref 0–5)

## 2024-06-05 LAB — PSA: Prostate Specific Ag, Serum: 1.2 ng/mL (ref 0.0–4.0)

## 2024-06-05 LAB — URIC ACID: Uric Acid: 7.6 mg/dL (ref 3.8–8.4)

## 2024-06-05 NOTE — Progress Notes (Signed)
 Results through MyChart

## 2024-06-05 NOTE — Progress Notes (Signed)
 Labs show chronically low HDL cholesterol, other other cholesterol numbers are okay.  Diabetes marker 7.8%.  Liver and electrolytes okay.  Urine okay.  Blood counts okay.  Thyroid  okay.  Prostate marker okay.  Uric acid improved.  Still pending microalbumin kidney marker  I recommend using one of the weekly injectables such as Ozempic or Mounjaro for both diabetes blood sugar control and help with weight loss.  If agreeable we will see if insurance will cover Mounjaro once weekly injectable.  Side effects can include nausea or indigestion or constipation but usually people do really well on this medication.  It has 6 doses and we increased 1 dose per month until we get to a higher dose that is working well  We would continue metformin  for the first month on the Mounjaro but we would potentially stop the metformin  depending on his success with the Mounjaro  Continue cholesterol medicine as usual  Work on dietary changes to lower the calorie intake to help lose weight  I can also refer to nutritionist if agreeable to also help with dietary changes and weight loss

## 2024-06-06 ENCOUNTER — Other Ambulatory Visit (HOSPITAL_COMMUNITY): Payer: Self-pay

## 2024-06-06 ENCOUNTER — Other Ambulatory Visit: Payer: Self-pay | Admitting: Medical

## 2024-06-06 ENCOUNTER — Telehealth: Payer: Self-pay

## 2024-06-06 MED ORDER — TRULICITY 0.75 MG/0.5ML ~~LOC~~ SOAJ: 0.7500 mg | SUBCUTANEOUS | 0 refills | Status: AC

## 2024-06-06 MED ORDER — MOUNJARO 2.5 MG/0.5ML ~~LOC~~ SOAJ: 2.5000 mg | SUBCUTANEOUS | 0 refills | Status: DC

## 2024-06-06 NOTE — Telephone Encounter (Signed)
 Pharmacy Patient Advocate Encounter  Received notification from [facility] that Prior Authorization for tirzepatide  (MOUNJARO ) 2.5 MG/0.5ML Pen has been DENIED.  Full denial letter will be uploaded to the media tab. See denial reason below.

## 2024-06-06 NOTE — Telephone Encounter (Signed)
 Pharmacy Patient Advocate Encounter   Received notification from [employer] that prior authorization for tirzepatide  (MOUNJARO ) 2.5 MG/0.5ML Pen  is required/requested.   Insurance verification completed.   The patient is insured through [facility].   Per test claim: PA required; PA submitted to above mentioned insurance via Latent Key/confirmation #/EOC [id number] Status is pending

## 2024-06-09 ENCOUNTER — Other Ambulatory Visit (HOSPITAL_COMMUNITY): Payer: Self-pay

## 2024-07-22 NOTE — Progress Notes (Signed)
 Abdulhadi Stopa                                          MRN: [id number]   07/22/2024   The [facility] Quality Team Specialist reviewed this patient medical record for the purposes of chart review for care gap closure. The following were reviewed: chart review for care gap closure-diabetic eye exam.    [facility] Quality Team
